# Patient Record
Sex: Male | Born: 1965 | Race: Black or African American | Hispanic: No | Marital: Married | Smoking: Never smoker
Health system: Southern US, Community
[De-identification: ages and names within clinical notes are randomized; demographics above are authoritative.]

## PROBLEM LIST (undated history)

## (undated) DIAGNOSIS — R45851 Suicidal ideations: Secondary | ICD-10-CM

## (undated) DIAGNOSIS — Z8659 Personal history of other mental and behavioral disorders: Secondary | ICD-10-CM

## (undated) DIAGNOSIS — R45850 Homicidal ideations: Secondary | ICD-10-CM

## (undated) DIAGNOSIS — F4310 Post-traumatic stress disorder, unspecified: Secondary | ICD-10-CM

## (undated) DIAGNOSIS — F319 Bipolar disorder, unspecified: Secondary | ICD-10-CM

## (undated) HISTORY — PX: APPENDECTOMY: SHX54

---

## 2018-09-07 ENCOUNTER — Emergency Department (HOSPITAL_COMMUNITY): Payer: Self-pay

## 2018-09-07 ENCOUNTER — Inpatient Hospital Stay (HOSPITAL_COMMUNITY)
Admission: EM | Admit: 2018-09-07 | Discharge: 2018-09-09 | DRG: 208 | Disposition: A | Discharge status: Home / Self Care | Payer: Self-pay | Attending: Pulmonary Disease | Admitting: Pulmonary Disease

## 2018-09-07 ENCOUNTER — Encounter (HOSPITAL_COMMUNITY): Payer: Self-pay | Admitting: Emergency Medicine

## 2018-09-07 ENCOUNTER — Inpatient Hospital Stay (HOSPITAL_COMMUNITY): Payer: Self-pay

## 2018-09-07 ENCOUNTER — Other Ambulatory Visit: Payer: Self-pay

## 2018-09-07 DIAGNOSIS — R401 Stupor: Secondary | ICD-10-CM

## 2018-09-07 DIAGNOSIS — T17910A Gastric contents in respiratory tract, part unspecified causing asphyxiation, initial encounter: Secondary | ICD-10-CM | POA: Diagnosis present

## 2018-09-07 DIAGNOSIS — J9602 Acute respiratory failure with hypercapnia: Principal | ICD-10-CM | POA: Diagnosis present

## 2018-09-07 DIAGNOSIS — Y9259 Other trade areas as the place of occurrence of the external cause: Secondary | ICD-10-CM

## 2018-09-07 DIAGNOSIS — Z0189 Encounter for other specified special examinations: Secondary | ICD-10-CM

## 2018-09-07 DIAGNOSIS — J9600 Acute respiratory failure, unspecified whether with hypoxia or hypercapnia: Secondary | ICD-10-CM | POA: Diagnosis present

## 2018-09-07 DIAGNOSIS — X58XXXA Exposure to other specified factors, initial encounter: Secondary | ICD-10-CM | POA: Diagnosis present

## 2018-09-07 DIAGNOSIS — J9601 Acute respiratory failure with hypoxia: Secondary | ICD-10-CM

## 2018-09-07 DIAGNOSIS — E872 Acidosis: Secondary | ICD-10-CM | POA: Diagnosis present

## 2018-09-07 DIAGNOSIS — Z7401 Bed confinement status: Secondary | ICD-10-CM

## 2018-09-07 DIAGNOSIS — G92 Toxic encephalopathy: Secondary | ICD-10-CM | POA: Diagnosis present

## 2018-09-07 DIAGNOSIS — Z23 Encounter for immunization: Secondary | ICD-10-CM

## 2018-09-07 DIAGNOSIS — R000 Tachycardia, unspecified: Secondary | ICD-10-CM | POA: Diagnosis present

## 2018-09-07 DIAGNOSIS — R451 Restlessness and agitation: Secondary | ICD-10-CM | POA: Diagnosis not present

## 2018-09-07 DIAGNOSIS — E876 Hypokalemia: Secondary | ICD-10-CM | POA: Diagnosis present

## 2018-09-07 DIAGNOSIS — R739 Hyperglycemia, unspecified: Secondary | ICD-10-CM | POA: Diagnosis present

## 2018-09-07 DIAGNOSIS — T407X5A Adverse effect of cannabis (derivatives), initial encounter: Secondary | ICD-10-CM | POA: Diagnosis present

## 2018-09-07 LAB — URINALYSIS, ROUTINE W REFLEX MICROSCOPIC
BILIRUBIN URINE: NEGATIVE
GLUCOSE, UA: 50 mg/dL — AB
Ketones, ur: NEGATIVE mg/dL
LEUKOCYTES UA: NEGATIVE
NITRITE: NEGATIVE
PH: 6 (ref 5.0–8.0)
Protein, ur: 100 mg/dL — AB
SPECIFIC GRAVITY, URINE: 1.013 (ref 1.005–1.030)

## 2018-09-07 LAB — COMPREHENSIVE METABOLIC PANEL
ALT: 52 U/L — ABNORMAL HIGH (ref 0–44)
AST: 47 U/L — ABNORMAL HIGH (ref 15–41)
Albumin: 4.1 g/dL (ref 3.5–5.0)
Alkaline Phosphatase: 62 U/L (ref 38–126)
Anion gap: 26 — ABNORMAL HIGH (ref 5–15)
BILIRUBIN TOTAL: 1 mg/dL (ref 0.3–1.2)
BUN: 8 mg/dL (ref 6–20)
CALCIUM: 8.9 mg/dL (ref 8.9–10.3)
CO2: 13 mmol/L — ABNORMAL LOW (ref 22–32)
CREATININE: 1.59 mg/dL — AB (ref 0.61–1.24)
Chloride: 103 mmol/L (ref 98–111)
GFR, EST AFRICAN AMERICAN: 56 mL/min — AB (ref >60)
GFR, EST NON AFRICAN AMERICAN: 48 mL/min — AB (ref >60)
Glucose, Bld: 213 mg/dL — ABNORMAL HIGH (ref 70–99)
Potassium: 3.2 mmol/L — ABNORMAL LOW (ref 3.5–5.1)
Sodium: 142 mmol/L (ref 135–145)
TOTAL PROTEIN: 7 g/dL (ref 6.5–8.1)

## 2018-09-07 LAB — CBC WITH DIFFERENTIAL/PLATELET
Abs Immature Granulocytes: 0.1 10*3/uL (ref 0.0–0.1)
BASOS PCT: 1 %
Basophils Absolute: 0.1 10*3/uL (ref 0.0–0.1)
EOS ABS: 0.1 10*3/uL (ref 0.0–0.7)
Eosinophils Relative: 1 %
HCT: 44.3 % (ref 39.0–52.0)
Hemoglobin: 13.8 g/dL (ref 13.0–17.0)
IMMATURE GRANULOCYTES: 1 %
LYMPHS ABS: 5.7 10*3/uL — AB (ref 0.7–4.0)
Lymphocytes Relative: 63 %
MCH: 30.9 pg (ref 26.0–34.0)
MCHC: 31.2 g/dL (ref 30.0–36.0)
MCV: 99.3 fL (ref 78.0–100.0)
MONO ABS: 0.4 10*3/uL (ref 0.1–1.0)
MONOS PCT: 4 %
Neutro Abs: 2.6 10*3/uL (ref 1.7–7.7)
Neutrophils Relative %: 30 %
PLATELETS: 221 10*3/uL (ref 150–400)
RBC: 4.46 MIL/uL (ref 4.22–5.81)
RDW: 11.7 % (ref 11.5–15.5)
WBC: 9 10*3/uL (ref 4.0–10.5)

## 2018-09-07 LAB — RAPID URINE DRUG SCREEN, HOSP PERFORMED
AMPHETAMINES: NOT DETECTED
Barbiturates: NOT DETECTED
Benzodiazepines: NOT DETECTED
Cocaine: NOT DETECTED
Opiates: NOT DETECTED
TETRAHYDROCANNABINOL: POSITIVE — AB

## 2018-09-07 LAB — CBG MONITORING, ED: GLUCOSE-CAPILLARY: 97 mg/dL (ref 70–99)

## 2018-09-07 LAB — LACTIC ACID, PLASMA
Lactic Acid, Venous: 4.1 mmol/L (ref 0.5–1.9)
Lactic Acid, Venous: 1.6 mmol/L (ref 0.5–1.9)

## 2018-09-07 LAB — MAGNESIUM: Magnesium: 2.2 mg/dL (ref 1.7–2.4)

## 2018-09-07 LAB — I-STAT TROPONIN, ED: TROPONIN I, POC: 0.01 ng/mL (ref 0.00–0.08)

## 2018-09-07 LAB — POCT I-STAT 3, ART BLOOD GAS (G3+)
Acid-base deficit: 6 mmol/L — ABNORMAL HIGH (ref 0.0–2.0)
Bicarbonate: 19.3 mmol/L — ABNORMAL LOW (ref 20.0–28.0)
O2 Saturation: 100 %
Patient temperature: 98.4
TCO2: 20 mmol/L — ABNORMAL LOW (ref 22–32)
pCO2 arterial: 35.8 mmHg (ref 32.0–48.0)
pH, Arterial: 7.339 — ABNORMAL LOW (ref 7.350–7.450)
pO2, Arterial: 181 mmHg — ABNORMAL HIGH (ref 83.0–108.0)

## 2018-09-07 LAB — GLUCOSE, CAPILLARY: Glucose-Capillary: 93 mg/dL (ref 70–99)

## 2018-09-07 LAB — I-STAT CHEM 8, ED
BUN: 7 mg/dL (ref 6–20)
CALCIUM ION: 1.07 mmol/L — AB (ref 1.15–1.40)
CHLORIDE: 105 mmol/L (ref 98–111)
CREATININE: 1.5 mg/dL — AB (ref 0.61–1.24)
Glucose, Bld: 203 mg/dL — ABNORMAL HIGH (ref 70–99)
HEMATOCRIT: 43 % (ref 39.0–52.0)
Hemoglobin: 14.6 g/dL (ref 13.0–17.0)
Potassium: 3.1 mmol/L — ABNORMAL LOW (ref 3.5–5.1)
SODIUM: 141 mmol/L (ref 135–145)
TCO2: 16 mmol/L — AB (ref 22–32)

## 2018-09-07 LAB — I-STAT VENOUS BLOOD GAS, ED
Acid-base deficit: 9 mmol/L — ABNORMAL HIGH (ref 0.0–2.0)
BICARBONATE: 20 mmol/L (ref 20.0–28.0)
O2 Saturation: 79 %
PCO2 VEN: 54.9 mmHg (ref 44.0–60.0)
TCO2: 22 mmol/L (ref 22–32)
pH, Ven: 7.169 — CL (ref 7.250–7.430)
pO2, Ven: 55 mmHg — ABNORMAL HIGH (ref 32.0–45.0)

## 2018-09-07 LAB — I-STAT CG4 LACTIC ACID, ED
Lactic Acid, Venous: 13.01 mmol/L (ref 0.5–1.9)
Lactic Acid, Venous: 6.71 mmol/L (ref 0.5–1.9)

## 2018-09-07 LAB — CREATININE, SERUM
Creatinine, Ser: 1.26 mg/dL — ABNORMAL HIGH (ref 0.61–1.24)
GFR calc Af Amer: >60 mL/min (ref >60)
GFR calc non Af Amer: >60 mL/min (ref >60)

## 2018-09-07 LAB — CBC
HCT: 47.1 % (ref 39.0–52.0)
Hemoglobin: 15 g/dL (ref 13.0–17.0)
MCH: 31.4 pg (ref 26.0–34.0)
MCHC: 31.8 g/dL (ref 30.0–36.0)
MCV: 98.7 fL (ref 78.0–100.0)
Platelets: 202 10*3/uL (ref 150–400)
RBC: 4.77 MIL/uL (ref 4.22–5.81)
RDW: 11.7 % (ref 11.5–15.5)
WBC: 3.9 10*3/uL — ABNORMAL LOW (ref 4.0–10.5)

## 2018-09-07 LAB — MRSA PCR SCREENING: MRSA by PCR: NEGATIVE

## 2018-09-07 LAB — TRIGLYCERIDES: Triglycerides: 231 mg/dL — ABNORMAL HIGH (ref <150)

## 2018-09-07 LAB — PROTIME-INR
INR: 1.26
Prothrombin Time: 15.7 seconds — ABNORMAL HIGH (ref 11.4–15.2)

## 2018-09-07 LAB — ACETAMINOPHEN LEVEL

## 2018-09-07 LAB — LIPASE, BLOOD: Lipase: 107 U/L — ABNORMAL HIGH (ref 11–51)

## 2018-09-07 LAB — CK: Total CK: 471 U/L — ABNORMAL HIGH (ref 49–397)

## 2018-09-07 LAB — ETHANOL: ALCOHOL ETHYL (B): 30 mg/dL — AB (ref <10)

## 2018-09-07 LAB — AMMONIA: Ammonia: 114 umol/L — ABNORMAL HIGH (ref 9–35)

## 2018-09-07 LAB — PHOSPHORUS: Phosphorus: 5 mg/dL — ABNORMAL HIGH (ref 2.5–4.6)

## 2018-09-07 MED ORDER — ORAL CARE MOUTH RINSE
15.0000 mL | OROMUCOSAL | Status: DC
  Administered 2018-09-07 – 2018-09-08 (×6): 15 mL via OROMUCOSAL

## 2018-09-07 MED ORDER — FENTANYL CITRATE (PF) 100 MCG/2ML IJ SOLN
INTRAMUSCULAR | Status: AC | PRN
  Administered 2018-09-07: 100 ug via INTRAVENOUS

## 2018-09-07 MED ORDER — FENTANYL 2500MCG IN NS 250ML (10MCG/ML) PREMIX INFUSION
25.0000 ug/h | INTRAVENOUS | Status: DC
  Administered 2018-09-08: 50 ug/h via INTRAVENOUS
  Filled 2018-09-07: qty 250

## 2018-09-07 MED ORDER — FENTANYL CITRATE (PF) 100 MCG/2ML IJ SOLN: 100.0000 ug | INTRAMUSCULAR | Status: DC | PRN

## 2018-09-07 MED ORDER — FENTANYL CITRATE (PF) 100 MCG/2ML IJ SOLN
INTRAMUSCULAR | Status: AC
  Filled 2018-09-07: qty 2

## 2018-09-07 MED ORDER — SUCCINYLCHOLINE CHLORIDE 20 MG/ML IJ SOLN
INTRAMUSCULAR | Status: AC | PRN
  Administered 2018-09-07: 120 mg via INTRAVENOUS

## 2018-09-07 MED ORDER — CHLORHEXIDINE GLUCONATE 0.12% ORAL RINSE (MEDLINE KIT)
15.0000 mL | Freq: Two times a day (BID) | OROMUCOSAL | Status: DC
  Administered 2018-09-07 – 2018-09-08 (×2): 15 mL via OROMUCOSAL

## 2018-09-07 MED ORDER — MIDAZOLAM HCL 2 MG/2ML IJ SOLN
INTRAMUSCULAR | Status: AC
  Filled 2018-09-07: qty 6

## 2018-09-07 MED ORDER — PROPOFOL 1000 MG/100ML IV EMUL
5.0000 ug/kg/min | Freq: Once | INTRAVENOUS | Status: AC
  Administered 2018-09-07: 50 ug/kg/min via INTRAVENOUS
  Filled 2018-09-07: qty 100

## 2018-09-07 MED ORDER — PROPOFOL 1000 MG/100ML IV EMUL
5.0000 ug/kg/min | Freq: Once | INTRAVENOUS | Status: DC
  Administered 2018-09-07: 35 ug/kg/min via INTRAVENOUS

## 2018-09-07 MED ORDER — INFLUENZA VAC SPLIT QUAD 0.5 ML IM SUSY
0.5000 mL | PREFILLED_SYRINGE | INTRAMUSCULAR | Status: AC
  Administered 2018-09-08: 0.5 mL via INTRAMUSCULAR

## 2018-09-07 MED ORDER — MIDAZOLAM HCL 5 MG/5ML IJ SOLN
INTRAMUSCULAR | Status: AC | PRN
  Administered 2018-09-07: 5 mg via INTRAVENOUS

## 2018-09-07 MED ORDER — PROPOFOL 1000 MG/100ML IV EMUL
INTRAVENOUS | Status: AC
  Filled 2018-09-07: qty 100

## 2018-09-07 MED ORDER — SODIUM CHLORIDE 0.9 % IV SOLN
INTRAVENOUS | Status: AC | PRN
  Administered 2018-09-07 (×2): 1000 mL via INTRAVENOUS

## 2018-09-07 MED ORDER — SODIUM CHLORIDE 0.9 % IV BOLUS
30.0000 mL/kg | Freq: Once | INTRAVENOUS | Status: AC
  Administered 2018-09-07: 3135 mL via INTRAVENOUS

## 2018-09-07 MED ORDER — POTASSIUM CHLORIDE 10 MEQ/100ML IV SOLN
10.0000 meq | INTRAVENOUS | Status: AC
  Administered 2018-09-07 – 2018-09-08 (×4): 10 meq via INTRAVENOUS
  Filled 2018-09-07: qty 100

## 2018-09-07 MED ORDER — ETOMIDATE 2 MG/ML IV SOLN
INTRAVENOUS | Status: AC | PRN
  Administered 2018-09-07: 20 mg via INTRAVENOUS

## 2018-09-07 MED ORDER — SODIUM CHLORIDE 0.9 % IV SOLN
2.0000 g | Freq: Three times a day (TID) | INTRAVENOUS | Status: DC
  Administered 2018-09-07 – 2018-09-09 (×6): 2 g via INTRAVENOUS
  Filled 2018-09-07 (×7): qty 2

## 2018-09-07 MED ORDER — ONDANSETRON HCL 4 MG/2ML IJ SOLN
INTRAMUSCULAR | Status: AC
  Filled 2018-09-07: qty 2

## 2018-09-07 MED ORDER — MIDAZOLAM HCL 2 MG/2ML IJ SOLN
2.0000 mg | INTRAMUSCULAR | Status: DC | PRN
  Administered 2018-09-08 (×2): 2 mg via INTRAVENOUS
  Filled 2018-09-07 (×2): qty 2

## 2018-09-07 MED ORDER — SODIUM CHLORIDE 0.9 % IV SOLN: 250.0000 mL | INTRAVENOUS | Status: DC | PRN

## 2018-09-07 MED ORDER — MIDAZOLAM HCL 2 MG/2ML IJ SOLN: 5.0000 mg | INTRAMUSCULAR | Status: DC | PRN

## 2018-09-07 MED ORDER — HEPARIN SODIUM (PORCINE) 5000 UNIT/ML IJ SOLN
5000.0000 [IU] | Freq: Three times a day (TID) | INTRAMUSCULAR | Status: DC
  Administered 2018-09-07 – 2018-09-09 (×3): 5000 [IU] via SUBCUTANEOUS
  Filled 2018-09-07 (×4): qty 1

## 2018-09-07 MED ORDER — SODIUM CHLORIDE 0.9 % IV SOLN
INTRAVENOUS | Status: AC | PRN
  Administered 2018-09-07: 1000 mL via INTRAVENOUS

## 2018-09-07 MED ORDER — PROPOFOL 1000 MG/100ML IV EMUL
5.0000 ug/kg/min | INTRAVENOUS | Status: DC
  Administered 2018-09-07: 60 ug/kg/min via INTRAVENOUS
  Administered 2018-09-08: 40 ug/kg/min via INTRAVENOUS
  Filled 2018-09-07: qty 100

## 2018-09-07 MED ORDER — INSULIN ASPART 100 UNIT/ML ~~LOC~~ SOLN: 2.0000 [IU] | SUBCUTANEOUS | Status: DC

## 2018-09-07 MED ORDER — FENTANYL BOLUS VIA INFUSION
50.0000 ug | INTRAVENOUS | Status: DC | PRN
  Administered 2018-09-08 (×4): 50 ug via INTRAVENOUS
  Filled 2018-09-07: qty 50

## 2018-09-07 MED ORDER — FAMOTIDINE IN NACL 20-0.9 MG/50ML-% IV SOLN
20.0000 mg | Freq: Two times a day (BID) | INTRAVENOUS | Status: DC
  Administered 2018-09-07 – 2018-09-08 (×2): 20 mg via INTRAVENOUS
  Filled 2018-09-07: qty 50

## 2018-09-07 NOTE — Progress Notes (Signed)
Patient was transported to 2H04 without incident. Report was called to Merlene LaughterSue Palmer, RRT.

## 2018-09-07 NOTE — Progress Notes (Signed)
PCCM Interval Note   Patient wife at bedside. States that patient went to [place] today to meet someone regarding a car purchase. 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 She denies that he has been short of breath or coughing.   Jovita KussmaulKatalina Eubanks, AGACNP-BC [facility]  Pgr: [phone]  PCCM Pgr: [phone]

## 2018-09-07 NOTE — ED Notes (Signed)
Dr. Trude McburneyPfifer notified of abnormal CG-4 and VBG
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
## 2018-09-07 NOTE — Progress Notes (Signed)
eLink Physician-Brief Progress Note Patient Name: Seth Mcintyre DOB: 1966-01-14 MRN: [id number]   Date of Service  09/07/2018  HPI/Events of Note  Agitation - Currently on a Propofol IV infusion which needs to be reordered.   eICU Interventions  Will order: 1. Propofol IV infusion. Titrate to RASS = 0.         Seth Mcintyre 09/07/2018, 8:01 PM

## 2018-09-07 NOTE — ED Notes (Signed)
Patient transported to CT 

## 2018-09-07 NOTE — Progress Notes (Signed)
Patient was transported to CT and back without incident  
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
## 2018-09-08 ENCOUNTER — Inpatient Hospital Stay (HOSPITAL_COMMUNITY): Payer: Self-pay

## 2018-09-08 ENCOUNTER — Encounter (HOSPITAL_COMMUNITY): Payer: Self-pay

## 2018-09-08 DIAGNOSIS — J9601 Acute respiratory failure with hypoxia: Secondary | ICD-10-CM

## 2018-09-08 LAB — GLUCOSE, CAPILLARY
Glucose-Capillary: 100 mg/dL — ABNORMAL HIGH (ref 70–99)
Glucose-Capillary: 105 mg/dL — ABNORMAL HIGH (ref 70–99)
Glucose-Capillary: 107 mg/dL — ABNORMAL HIGH (ref 70–99)
Glucose-Capillary: 88 mg/dL (ref 70–99)
Glucose-Capillary: 90 mg/dL (ref 70–99)

## 2018-09-08 LAB — BLOOD GAS, ARTERIAL
Acid-base deficit: 2.5 mmol/L — ABNORMAL HIGH (ref 0.0–2.0)
Bicarbonate: 22.6 mmol/L (ref 20.0–28.0)
Drawn by: 24513
FIO2: 0.4
MECHVT: 560 mL
O2 Saturation: 98.3 %
PEEP: 5 cmH2O
Patient temperature: 98.4
RATE: 14 resp/min
pCO2 arterial: 44.2 mmHg (ref 32.0–48.0)
pH, Arterial: 7.328 — ABNORMAL LOW (ref 7.350–7.450)
pO2, Arterial: 124 mmHg — ABNORMAL HIGH (ref 83.0–108.0)

## 2018-09-08 LAB — POCT I-STAT 3, ART BLOOD GAS (G3+)
Acid-base deficit: 3 mmol/L — ABNORMAL HIGH (ref 0.0–2.0)
Bicarbonate: 22.9 mmol/L (ref 20.0–28.0)
O2 Saturation: 99 %
Patient temperature: 99.7
TCO2: 24 mmol/L (ref 22–32)
pCO2 arterial: 42.6 mmHg (ref 32.0–48.0)
pH, Arterial: 7.34 — ABNORMAL LOW (ref 7.350–7.450)
pO2, Arterial: 131 mmHg — ABNORMAL HIGH (ref 83.0–108.0)

## 2018-09-08 LAB — BASIC METABOLIC PANEL
Anion gap: 10 (ref 5–15)
BUN: 13 mg/dL (ref 6–20)
CO2: 23 mmol/L (ref 22–32)
Calcium: 7.5 mg/dL — ABNORMAL LOW (ref 8.9–10.3)
Chloride: 109 mmol/L (ref 98–111)
Creatinine, Ser: 1.18 mg/dL (ref 0.61–1.24)
GFR calc Af Amer: >60 mL/min (ref >60)
GFR calc non Af Amer: >60 mL/min (ref >60)
Glucose, Bld: 108 mg/dL — ABNORMAL HIGH (ref 70–99)
Potassium: 4 mmol/L (ref 3.5–5.1)
Sodium: 142 mmol/L (ref 135–145)

## 2018-09-08 LAB — CBC
HCT: 43.4 % (ref 39.0–52.0)
Hemoglobin: 14.3 g/dL (ref 13.0–17.0)
MCH: 31.1 pg (ref 26.0–34.0)
MCHC: 32.9 g/dL (ref 30.0–36.0)
MCV: 94.3 fL (ref 78.0–100.0)
Platelets: 190 10*3/uL (ref 150–400)
RBC: 4.6 MIL/uL (ref 4.22–5.81)
RDW: 12.1 % (ref 11.5–15.5)
WBC: 5.8 10*3/uL (ref 4.0–10.5)

## 2018-09-08 LAB — URINE CULTURE: Culture: NO GROWTH

## 2018-09-08 LAB — PROTIME-INR
INR: 1.23
Prothrombin Time: 15.4 seconds — ABNORMAL HIGH (ref 11.4–15.2)

## 2018-09-08 MED ORDER — PROPOFOL 1000 MG/100ML IV EMUL
INTRAVENOUS | Status: AC
  Filled 2018-09-08: qty 100

## 2018-09-08 MED ORDER — ORAL CARE MOUTH RINSE
15.0000 mL | Freq: Two times a day (BID) | OROMUCOSAL | Status: DC
  Administered 2018-09-09: 15 mL via OROMUCOSAL

## 2018-09-08 MED ORDER — ONDANSETRON HCL 4 MG/2ML IJ SOLN
4.0000 mg | Freq: Four times a day (QID) | INTRAMUSCULAR | Status: DC | PRN
  Administered 2018-09-08 (×2): 4 mg via INTRAVENOUS
  Filled 2018-09-08 (×2): qty 2

## 2018-09-08 NOTE — Procedures (Addendum)
Extubation Procedure Note  Patient Details:   Name: Seth Mcintyre DOB: Jul 31, 1966 MRN: [id number]   Airway Documentation:    Vent end date: 09/08/18 Vent end time: 0820   Evaluation  O2 sats: stable throughout Complications: No apparent complications Patient did tolerate procedure well. Bilateral Breath Sounds: Clear   Yes   Pt placed on 3 L [place], tolerating well.  No stridor noted.  Pt able to reach 2250 mL using incentive spirometer.  Forest BeckerJean S Ismar Yabut 09/08/2018, 9:12 AM

## 2018-09-08 NOTE — Progress Notes (Signed)
NAME:  Seth Mcintyre, MRN:  [id number], DOB:  1966/08/15, LOS: 1 ADMISSION DATE:  09/07/2018, CONSULTATION DATE:   REFERRING MD:  ER physician CHIEF COMPLAINT:  Respiratory failure  Brief History   Patient found at a hotel with bradypnea, presumably unresponsive. According to the ER nurse, patient's respiratory rate was 4/min. He received 3 doses of narcan with an increase in respiratory rate to 17/min At the time of my evaluation, patient was intubated and on propofol at 835mcg/kg/min No other history available at this time. Significant Hospital Events   09/08/18: Patient regained consciousness and is off sedation.  Consults: date of consult/date signed off & final recs:   Procedures (surgical and bedside):   Significant Diagnostic Tests:  09/07/08: CT Head showed no acute findings. 09/08/18: CT Chest showed IMPRESSION: Partial consolidative changes of the lower lobes consistent with atelectasis or infiltrate. Areas of interstitial prominence and hazy airspace density may represent edema or ARDS  Micro Data:   Antimicrobials:    Subjective:  Awake and alert and following commands. Objective   Blood pressure 122/81, pulse 79, temperature 99.7 F (37.6 C), temperature source Oral, resp. rate 11, height 5\' 9"  (1.753 m), weight 95.2 kg, SpO2 100 %.    Vent Mode: PSV;CPAP FiO2 (%):  [40 %-100 %] 40 % Set Rate:  [14 bmp] 14 bmp Vt Set:  [560 mL] 560 mL PEEP:  [5 cmH20] 5 cmH20 Pressure Support:  [5 cmH20] 5 cmH20 Plateau Pressure:  [9 cmH20-20 cmH20] 20 cmH20   Intake/Output Summary (Last 24 hours) at 09/08/2018 0728 Last data filed at 09/08/2018 [id number] Gross per 24 hour  Intake 4832.16 ml  Output 1745 ml  Net 3087.16 ml   Filed Weights   09/07/18 1538 09/08/18 0422  Weight: 104.5 kg 95.2 kg    Examination: General: Awake. Follows commands and answers simple questions. HENT: PERL, No icterus. Cervical collar in place. ETT/OGT Lungs: Bilateral breath sounds. Clear to  auscultation. No wheezes, crackles or rhonchi Cardiovascular: normal s1s2, regular rhythm. No murmur or gallop. No rub. Abdomen: soft, non tender. No organomegaly or mass. Extremities: Warm to touch. No cyanosis, clubbing or edema. Neuro: Normal muscle strength. AxO x3 GU: Foley catheter  Resolved Hospital Problem list    Assessment & Plan:  Acute respiratory failure with hypercapnia - resolved. Etiology remains unclear. - SBT with probable extubation.  Encephalopathy -etiology unclear -ct head without findings -obtain eeg - will consider neurology evaluation - clinically resolved off sedation.  Hypokalemia -f/u bmp today  Hyperglycemia - begin s/s coverage  Lactic acidosis; resolved -etiology unclear -culture blood and urine. Start empiric antibiotic therapy for possible sepsis. CT chest with non specific findings.   Disposition / Summary of Today's Plan 09/08/18   See above    Diet: assess swallowing function after extubation Pain/Anxiety/Delirium protocol (if indicated): n/a VAP protocol (if indicated)yes DVT prophylaxis: sub q heparin GI prophylaxis: H2 blocker. Will d/c after extubation Hyperglycemia protocol: n/a Mobility: out of bed after extubation. Code Status: FULL Family Communication: no one available or present  Labs   CBC: Recent Labs  Lab 09/07/18 1530 09/07/18 1534 09/07/18 1731  WBC 9.0  --  3.9*  NEUTROABS 2.6  --   --   HGB 13.8 14.6 15.0  HCT 44.3 43.0 47.1  MCV 99.3  --  98.7  PLT 221  --  202   Basic Metabolic Panel: Recent Labs  Lab 09/07/18 1530 09/07/18 1534 09/07/18 1731  NA 142 141  --  K 3.2* 3.1*  --   CL 103 105  --   CO2 13*  --   --   GLUCOSE 213* 203*  --   BUN 8 7  --   CREATININE 1.59* 1.50* 1.26*  CALCIUM 8.9  --   --   MG  --   --  2.2  PHOS  --   --  5.0*   GFR: Estimated Creatinine Clearance: 78.1 mL/min (A) (by C-G formula based on SCr of 1.26 mg/dL (H)). Recent Labs  Lab 09/07/18 1530  09/07/18 1534 09/07/18 1731 09/07/18 1739 09/07/18 1918 09/07/18 2140  WBC 9.0  --  3.9*  --   --   --   LATICACIDVEN  --  13.01*  --  6.71* 4.1* 1.6   Liver Function Tests: Recent Labs  Lab 09/07/18 1530  AST 47*  ALT 52*  ALKPHOS 62  BILITOT 1.0  PROT 7.0  ALBUMIN 4.1   Recent Labs  Lab 09/07/18 1530  LIPASE 107*   Recent Labs  Lab 09/07/18 1731  AMMONIA 114*   ABG    Component Value Date/Time   PHART 7.328 (L) 09/08/2018 0311   PCO2ART 44.2 09/08/2018 0311   PO2ART 124 (H) 09/08/2018 0311   HCO3 22.6 09/08/2018 0311   TCO2 20 (L) 09/07/2018 2007   ACIDBASEDEF 2.5 (H) 09/08/2018 0311   O2SAT 98.3 09/08/2018 0311    Coagulation Profile: Recent Labs  Lab 09/07/18 1530  INR 1.26   Cardiac Enzymes: Recent Labs  Lab 09/07/18 1731  CKTOTAL 471*   HbA1C: No results found for: HGBA1C CBG: Recent Labs  Lab 09/07/18 1745 09/07/18 2002 09/07/18 2337 09/08/18 0312  GLUCAP 97 93 100* 88    Admitting History of Present Illness.   See above Review of Systems:   General: awake and alert Pulmonary: denies dyspnea Cardiac: denies chest pain GI: denies abdominal pain or nausea Past medical history  He,  has no past medical history on file.   Surgical History    Past Surgical History:  Procedure Laterality Date  . APPENDECTOMY       Social History   Social History   Socioeconomic History  . Marital status: Married    Spouse name: Shane Badeaux  . Number of children: Not on file  . Years of education: Not on file  . Highest education level: Not on file  Occupational History  . Not on file  Social Needs  . Financial resource strain: Not hard at all  . Food insecurity:    Worry: Never true    Inability: Never true  . Transportation needs:    Medical: No    Non-medical: No  Tobacco Use  . Smoking status: Never Smoker  . Smokeless tobacco: Never Used  Substance and Sexual Activity  . Alcohol use: Yes    Alcohol/week: 8.0 standard  drinks    Types: 8 Cans of beer per week  . Drug use: Yes    Types: Marijuana  . Sexual activity: Yes  Lifestyle  . Physical activity:    Days per week: 2 days    Minutes per session: 30 min  . Stress: Not at all  Relationships  . Social connections:    Talks on phone: Three times a week    Gets together: Three times a week    Attends religious service: Not on file    Active member of club or organization: Not on file    Attends meetings of clubs or organizations: Not  on file    Relationship status: Not on file  . Intimate partner violence:    Fear of current or ex partner: Not on file    Emotionally abused: Not on file    Physically abused: Not on file    Forced sexual activity: Not on file  Other Topics Concern  . Not on file  Social History Narrative  . Not on file  ,  reports that he has never smoked. He has never used smokeless tobacco. He reports that he drinks about 8.0 standard drinks of alcohol per week. He reports that he has current or past drug history. Drug: Marijuana.   Family history   His Family history is unknown by patient.   Allergies No Known Allergies  Home meds  Prior to Admission medications   Not on File      CRITICAL CARE Performed by: Elayne Snare   Total critical care time: 34 minutes  Critical care time was exclusive of separately billable procedures and treating other patients.  Critical care was necessary to treat or prevent imminent or life-threatening deterioration.  Critical care was time spent personally by me on the following activities: development of treatment plan with patient and/or surrogate as well as nursing, discussions with consultants, evaluation of patient's response to treatment, examination of patient, obtaining history from patient or surrogate, ordering and performing treatments and interventions, ordering and review of laboratory studies, ordering and review of radiographic studies, pulse oximetry and  re-evaluation of patient's condition.

## 2018-09-09 DIAGNOSIS — J9601 Acute respiratory failure with hypoxia: Secondary | ICD-10-CM

## 2018-09-09 NOTE — Progress Notes (Signed)
52 year old man found at a hotel with bradypnea and unresponsive received Narcan and was intubated. 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  He went to well hotel to meet [employer] regarding a car purchase and smoke some weed.  Denies use of any other drugs.  He does not remember who brought him to the hospital His main complaint today is diarrhea. He had bilateral infiltrates noted on CT and, attributed to aspiration and is on cefepime, leukocytosis is resolved and he is afebrile no sputum production. We will stop this antibiotic. He can be mobilized and if he feels well by midday, he can be discharged  Sheccid Lahmann V. Vassie LollAlva MD [phone]

## 2018-09-09 NOTE — Discharge Summary (Signed)
Physician Discharge Summary  Patient ID: Seth Mcintyre MRN: [id number] DOB/AGE: 18-Apr-1966 52 y.o.  Admit date: 09/07/2018 Discharge date: 09/09/2018    Discharge Diagnoses:  Active Problems:   Acute respiratory failure ([place])                                                       D/c plan by Discharge Diagnosis   Acute respiratory failure with hypercapnia - etiology remains unclear. ??r/t AMS, THC? UDS otherwise negative but not a complete screen.  ?aspiration - no leukocytosis, no sputum production, no dyspnea or cough post extubation  PLAN -  Pulmonary hygiene  PCP f/u  Monitor off abx   Encephalopathy - also unclear etiology but fully resolved. Neg head CT. ?r/t THC +/- additives.  PLAN -  PCP f/u   Lactic acidosis - resolved  PLAN -  PCP f/u with labs   Brief Summary: Seth Mcintyre is a 52 y.o. y/o male with no significant PMH who was admitted 9/21 after being found at a hotel with bradypnea, presumably unresponsive. According to the ER nurse, patient's respiratory rate was 4/min. He received 3 doses of narcan with an increase in respiratory rate to 17/min. He required intubation in ER.  PCCM called to admit.  UDS pos for THC only. He had bilateral infiltrates noted on CT and, attributed to aspiration and was started on cefepime, leukocytosis is resolved and he is afebrile no sputum production.  Cefepime d/c'd.  He was extubated 9/22. Remains hemodynamically stable, respiratory status stable on RA. Ambulating without difficulty on RA.  No c/o.  Wants to go home.    Consults: None   Lines/tubes: ETT 9/21>>9/22  Microbiology/Sepsis markers:   Significant Diagnostic Studies:  CT head 9/21>>>neg acute  CT chest 9/22>>>Partial consolidative changes of the lower lobes consistent with atelectasis or infiltrate. Areas of interstitial prominence and hazy airspace density may represent edema or ARDS.    Vitals:   09/09/18 1000 09/09/18 1100 09/09/18 1200 09/09/18  1232  BP: 131/81 106/70 122/77   Pulse: 85 80 74   Resp: 18 17 19    Temp:    98.8 F (37.1 C)  TempSrc:    Oral  SpO2: 94% 92% 93%   Weight:      Height:         Discharge Labs  BMET Recent Labs  Lab 09/07/18 1530 09/07/18 1534 09/07/18 1731 09/08/18 0709  NA 142 141  --  142  K 3.2* 3.1*  --  4.0  CL 103 105  --  109  CO2 13*  --   --  23  GLUCOSE 213* 203*  --  108*  BUN 8 7  --  13  CREATININE 1.59* 1.50* 1.26* 1.18  CALCIUM 8.9  --   --  7.5*  MG  --   --  2.2  --   PHOS  --   --  5.0*  --      CBC  Recent Labs  Lab 09/07/18 1530 09/07/18 1534 09/07/18 1731 09/08/18 0709  HGB 13.8 14.6 15.0 14.3  HCT 44.3 43.0 47.1 43.4  WBC 9.0  --  3.9* 5.8  PLT 221  --  202 190   Anti-Coagulation Recent Labs  Lab 09/07/18 1530 09/08/18 0709  INR 1.26 1.23  Follow-up Information    [facility]. Go on 10/09/2018.   Specialty:  Family Medicine Why:  at 1:50pm for hospital follow-up appointment. $20 visits  Contact information: [place] [address] [phone]           Allergies as of 09/09/2018   No Known Allergies     Medication List    TAKE these medications   multivitamin tablet Take 1 tablet by mouth daily.         Disposition:  home   Discharged Condition: Seth Mcintyre has met maximum benefit of inpatient care and is medically stable and cleared for discharge.  Patient is pending follow up as above.      Time spent on disposition:  Greater than 35 minutes.   SignedNickolas Madrid, NP 09/09/2018  1:35 PM Pager: [phone] or [phone]

## 2018-09-09 NOTE — Care Management Note (Signed)
Case Management Note  Patient Details  Name: Seth Mcintyre MRN: [id number] Date of Birth: 14-Mar-1966  Subjective/Objective:  52 y.o. y/o male with no significant PMH presented on 09/07/18 after being found at a hotel with bradypnea, presumably unresponsive.                Action/Plan: CM consult acknowledged with request to assist with arranging hospital f/u appt. CM met with patient to discuss transitional needs. Patient independent, lives at home with spouse, with no DME in use. Patient verbalized having primary insurance, but stated insurance card was home with spouse. CM advised patient to contact patient registration and provide insurance information for billing purposes, with patient verbalizing understanding. CM scheduled a hospital f/u appt with [facility] on 10/09/18 @ 1:50pm with patient informed/AVS updated; patient indicated the appt may conflict with his work schedule, with patient advised to reschedule, or contact his insurance company to self schedule an appt with an in-network provider. Patient verbalized his spouse would provide transportation home. No further CM needs at this time.   Expected Discharge Date:                  Expected Discharge Plan:  Home/Self Care  In-House Referral:  NA  Discharge planning Services  CM Consult, Follow-up appt scheduled  Post Acute Care Choice:  NA Choice offered to:  NA  DME Arranged:  N/A DME Agency:  NA  HH Arranged:  NA HH Agency:  NA  Status of Service:  Completed, signed off  If discussed at [place] of Stay Meetings, dates discussed:    Additional Comments:  Midge Minium RN, BSN, NCM-BC, ACM-RN [phone] 09/09/2018, 12:18 PM

## 2018-09-09 NOTE — Progress Notes (Signed)
Pt discharged to home with wife via private vehicle. All discharge education provided. States no pain and no further questions. No acutecardiopulmonary distress noted. Vital signs stable.  Alphonzo DublinChristina Jabreel Chimento, RN 09/09/2018 1600

## 2018-09-10 LAB — HIV ANTIBODY (ROUTINE TESTING W REFLEX): HIV Screen 4th Generation wRfx: NONREACTIVE

## 2018-09-12 LAB — CULTURE, BLOOD (ROUTINE X 2)
Culture: NO GROWTH
Culture: NO GROWTH

## 2018-10-09 ENCOUNTER — Inpatient Hospital Stay (INDEPENDENT_AMBULATORY_CARE_PROVIDER_SITE_OTHER): Payer: Self-pay | Admitting: Physician Assistant

## 2019-09-30 ENCOUNTER — Emergency Department (HOSPITAL_COMMUNITY)
Admission: EM | Admit: 2019-09-30 | Discharge: 2019-09-30 | Disposition: A | Discharge status: Home / Self Care | Payer: Self-pay | Attending: Emergency Medicine | Admitting: Emergency Medicine

## 2019-09-30 ENCOUNTER — Other Ambulatory Visit: Payer: Self-pay

## 2019-09-30 ENCOUNTER — Encounter (HOSPITAL_COMMUNITY): Payer: Self-pay

## 2019-09-30 DIAGNOSIS — K029 Dental caries, unspecified: Secondary | ICD-10-CM | POA: Insufficient documentation

## 2019-09-30 DIAGNOSIS — Z79899 Other long term (current) drug therapy: Secondary | ICD-10-CM | POA: Insufficient documentation

## 2019-09-30 MED ORDER — IBUPROFEN 400 MG PO TABS
400.0000 mg | ORAL_TABLET | Freq: Once | ORAL | Status: AC
  Administered 2019-09-30: 10:00:00 400 mg via ORAL
  Filled 2019-09-30: qty 1

## 2019-09-30 MED ORDER — CHLORHEXIDINE GLUCONATE 0.12 % MT SOLN: 15.0000 mL | Freq: Two times a day (BID) | OROMUCOSAL | 0 refills | Status: DC

## 2019-09-30 MED ORDER — HYDROCODONE-ACETAMINOPHEN 5-325 MG PO TABS
2.0000 | ORAL_TABLET | Freq: Once | ORAL | Status: AC
  Administered 2019-09-30: 2 via ORAL
  Filled 2019-09-30: qty 2

## 2019-09-30 MED ORDER — CLINDAMYCIN HCL 150 MG PO CAPS
450.0000 mg | ORAL_CAPSULE | Freq: Once | ORAL | Status: AC
  Administered 2019-09-30: 10:00:00 450 mg via ORAL
  Filled 2019-09-30: qty 3

## 2019-09-30 MED ORDER — CLINDAMYCIN HCL 150 MG PO CAPS: 450.0000 mg | ORAL_CAPSULE | Freq: Three times a day (TID) | ORAL | 0 refills | Status: AC

## 2019-09-30 NOTE — Discharge Instructions (Signed)
You have been diagnosed today with Dental Pain.  At this time there does not appear to be the presence of an emergent medical condition, however there is always the potential for conditions to change. Please read and follow the below instructions.  Please return to the Emergency Department immediately for any new or worsening symptoms. Please be sure to follow up with your Primary Care Provider within one week regarding your visit today; please call their office to schedule an appointment even if you are feeling better for a follow-up visit. Please take the antibiotic clindamycin as prescribed to avoid dental infection. You have been given medication today called Norco.  This is a narcotic medication.  Do not drive or operate machinery for the rest of the day as it can make you drowsy.  Do not drink alcohol or take other sedating medications for the rest of the day as this will worsen side effects. Please take Ibuprofen (Advil, motrin) and Tylenol (acetaminophen) to relieve your pain.  You may take up to 400 MG (2 pills) of normal strength ibuprofen every 8 hours as needed.  In between doses of ibuprofen you make take tylenol, up to 500 mg (one extra strength pill).  Do not take more than 3,000 mg tylenol in a 24 hour period.  Please check all medication labels as many medications such as pain and cold medications may contain tylenol.  Do not drink alcohol while taking these medications.  Do not take other NSAID'S while taking ibuprofen (such as aleve or naproxen).  Please take ibuprofen with food to decrease stomach upset. You may use the mouth rinse Peridex as prescribed up with symptoms.  You do not swallow Peridex.  Rinse and spit this medication.  Get help right away if: You cannot open your mouth. You are having trouble breathing or swallowing. You have a fever. Your face, neck, or jaw is swollen. You have any new/concerning or worsening of symptoms  Please read the additional information  packets attached to your discharge summary.  Do not take your medicine if  develop an itchy rash, swelling in your mouth or lips, or difficulty breathing; call 911 and seek immediate emergency medical attention if this occurs.  Note: Portions of this text may have been transcribed using voice recognition software. Every effort was made to ensure accuracy; however, inadvertent computerized transcription errors may still be present.

## 2019-09-30 NOTE — ED Triage Notes (Signed)
Pt c/o dental pain x 2-3 days. States unable to chew.

## 2019-09-30 NOTE — ED Provider Notes (Signed)
[facility] EMERGENCY DEPARTMENT Provider Note   CSN: [id number] Arrival date & time: 09/30/19  6237     History   Chief Complaint Chief Complaint  Patient presents with  . Dental Pain    HPI Seth Mcintyre is a 53 y.o. male otherwise healthy presents today for left upper dental pain x2 days.  Describes pain as a constant throbbing sensation moderate intensity worsened with chewing and without alleviating factors.  He has taken 2 Aleve without improvement.  Denies radiation of pain.  Reports tooth recently broke.  He reports that he does not have a dentist.  Denies fever/chills, headache/vision changes, trismus, swelling of face/head/neck, sore throat, difficulty swallowing, nausea/vomiting, abdominal pain, drooling or any additional concerns.     HPI  History reviewed. No pertinent past medical history.  Patient Active Problem List   Diagnosis Date Noted  . Acute respiratory failure ([place]) 09/07/2018    Past Surgical History:  Procedure Laterality Date  . APPENDECTOMY          Home Medications    Prior to Admission medications   Medication Sig Start Date End Date Taking? Authorizing Provider  chlorhexidine (PERIDEX) 0.12 % solution Use as directed 15 mLs in the mouth or throat 2 (two) times daily. Rinse and Spit; Do Not Swallow 09/30/19   Nuala Alpha A, PA-C  clindamycin (CLEOCIN) 150 MG capsule Take 3 capsules (450 mg total) by mouth 3 (three) times daily for 7 days. 09/30/19 10/07/19  Nuala Alpha A, PA-C  Multiple Vitamin (MULTIVITAMIN) tablet Take 1 tablet by mouth daily.    [provider]    Family History Family History  Family history unknown: Yes    Social History Social History   Tobacco Use  . Smoking status: Never Smoker  . Smokeless tobacco: Never Used  Substance Use Topics  . Alcohol use: Yes    Alcohol/week: 8.0 standard drinks    Types: 8 Cans of beer per week  . Drug use: Yes    Types: Marijuana      Allergies   Patient has no known allergies.   Review of Systems Review of Systems Ten systems are reviewed and are negative for acute change except as noted in the HPI   Physical Exam Updated Vital Signs BP (!) 155/97 (BP Location: Right Arm)   Pulse 65   Temp 99.2 F (37.3 C) (Oral)   Resp 16   Ht 5\' 9"  (1.753 m)   Wt 102.1 kg   SpO2 98%   BMI 33.23 kg/m   Physical Exam Constitutional:      General: He is not in acute distress.    Appearance: Normal appearance. He is well-developed. He is not ill-appearing or diaphoretic.  HENT:     Head: Normocephalic and atraumatic. No raccoon eyes or Battle's sign.     Jaw: There is normal jaw occlusion. No trismus.     Right Ear: External ear normal.     Left Ear: External ear normal.     Nose: Nose normal.     Mouth/Throat:      Comments: Very poor dentition overall with multiple missing teeth.  Pain to percussion of tooth #14.    The patient has normal phonation and is in control of secretions. No stridor.  Midline uvula without edema. Soft palate rises symmetrically. No tonsillar erythema, swelling or exudates. Tongue protrusion is normal, floor of mouth is soft. No trismus. No creptius on neck palpation. No gingival erythema, swelling or fluctuance  noted. Mucus membranes moist. Eyes:     General: Vision grossly intact. Gaze aligned appropriately.     Pupils: Pupils are equal, round, and reactive to light.  Neck:     Musculoskeletal: Full passive range of motion without pain, normal range of motion and neck supple. No neck rigidity or crepitus.     Trachea: Trachea and phonation normal. No tracheal tenderness or tracheal deviation.  Pulmonary:     Effort: Pulmonary effort is normal. No respiratory distress.  Abdominal:     General: There is no distension.     Palpations: Abdomen is soft.     Tenderness: There is no abdominal tenderness. There is no guarding or rebound.  Musculoskeletal: Normal range of motion.  Skin:     General: Skin is warm and dry.  Neurological:     Mental Status: He is alert.     GCS: GCS eye subscore is 4. GCS verbal subscore is 5. GCS motor subscore is 6.     Comments: Speech is clear and goal oriented, follows commands Major Cranial nerves without deficit, no facial droop Moves extremities without ataxia, coordination intact  Psychiatric:        Behavior: Behavior normal.      ED Treatments / Results  Labs (all labs ordered are listed, but only abnormal results are displayed) Labs Reviewed - No data to display  EKG None  Radiology No results found.  Procedures Procedures (including critical care time)  Medications Ordered in ED Medications  HYDROcodone-acetaminophen (NORCO/VICODIN) 5-325 MG per tablet 2 tablet (2 tablets Oral Given 09/30/19 1008)  clindamycin (CLEOCIN) capsule 450 mg (450 mg Oral Given 09/30/19 1008)  ibuprofen (ADVIL) tablet 400 mg (400 mg Oral Given 09/30/19 1008)     Initial Impression / Assessment and Plan / ED Course  I have reviewed the triage vital signs and the nursing notes.  Pertinent labs & imaging results that were available during my care of the patient were reviewed by me and considered in my medical decision making (see chart for details).    53 year old otherwise healthy male presents today with left upper dental pain.  Very poor dentition overall with multiple missing and infected teeth without signs or symptoms of dental abscess, no swelling/erythema/tenderness of the gums.  Patient is well-appearing, afebrile, nontoxic, speaking well.  Patient able to swallow without pain.  No signs of swelling or concern for Ludwig's angina/Peritonsilar abscess/Retropharyngeal abscess or other deep tissue infections.  No sign of swelling of the neck, patient has good range of motion of the neck, no trismus. Will treat with clindamycin for 450 mg 3 times daily, Peridex mouth rinse, OTC anti-inflammatories.  Dental resource guide given as no dental  coverage today, patient to call offices today after discharge. Patient given Norco here for acute dental pain, reports that he has a ride home from ER, narcotic precautions discussed with patient and he states understanding.  At this time there does not appear to be any evidence of an acute emergency medical condition and the patient appears stable for discharge with appropriate outpatient follow up. Diagnosis was discussed with patient who verbalizes understanding of care plan and is agreeable to discharge. I have discussed return precautions with patient who verbalizes understanding of return precautions. Patient encouraged to follow-up with their PCP and dentist. All questions answered. Patient has been discharged in good condition.   Note: Portions of this report may have been transcribed using voice recognition software. Every effort was made to ensure accuracy; however, inadvertent  computerized transcription errors may still be present. Final Clinical Impressions(s) / ED Diagnoses   Final diagnoses:  Pain due to dental caries    ED Discharge Orders         Ordered    Measure blood pressure  Status:  Canceled     09/30/19 1009    clindamycin (CLEOCIN) 150 MG capsule  3 times daily     09/30/19 1010    chlorhexidine (PERIDEX) 0.12 % solution  2 times daily     09/30/19 1010           Elizabeth Palau 09/30/19 1021    Terald Sleeper, MD 09/30/19 2028

## 2019-10-13 IMAGING — CT CT CHEST W/O CM
2 of 3 series · 15 of 36 positions shown, 18 images · non-contrast
Comparison: Chest radiograph dated 09/07/2018

CLINICAL DATA: 50-year-old male with acute respiratory failure.

EXAM:
CT CHEST WITHOUT CONTRAST
TECHNIQUE: Multidetector CT imaging of the chest was performed following the
standard protocol without IV contrast.

[Series 3: chest w/o 2mm st · axial · non-contrast · 0.98mm/px · z∈[+1310,+1564]mm · 12 of 149 slices shown, 15 images]
[im 11/149  mediastinal]
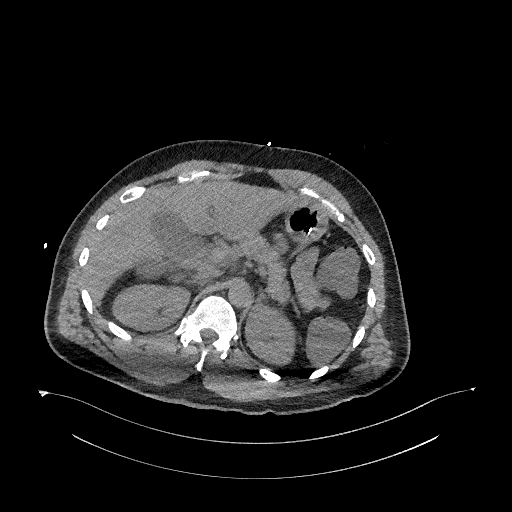
[im 11/149  lung]
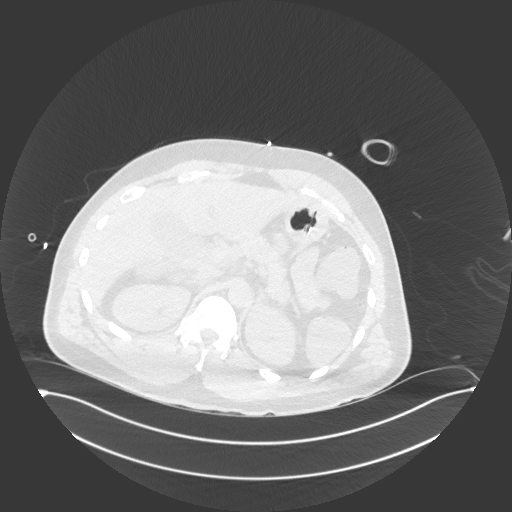
[im 22/149  lung]
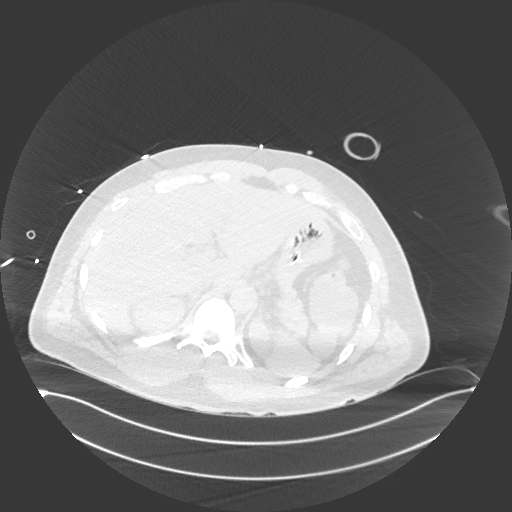
[im 33/149  lung]
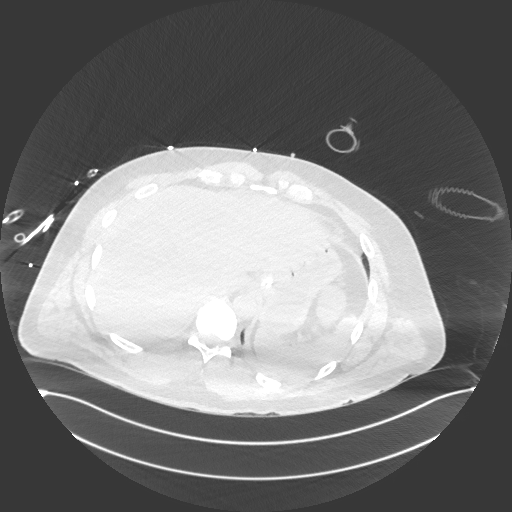
[im 44/149  lung]
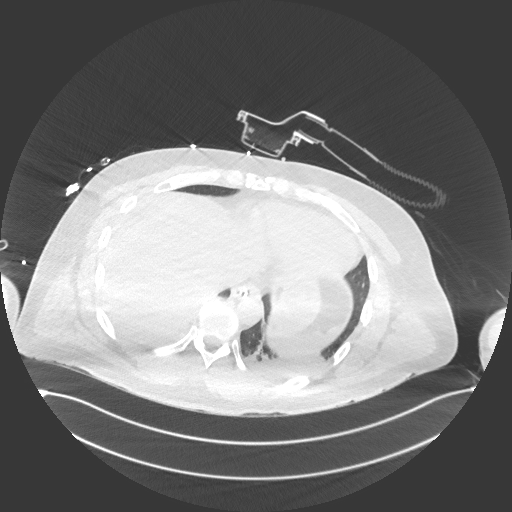
[im 55/149  mediastinal]
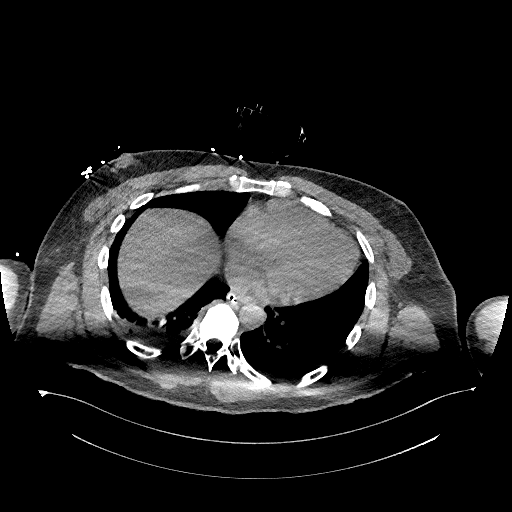
[im 55/149  lung]
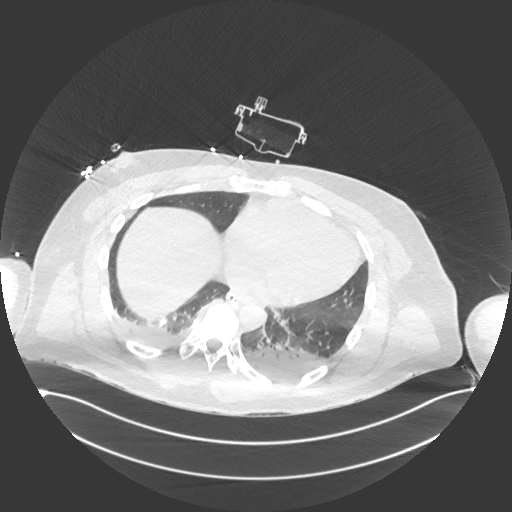
[im 66/149  lung]
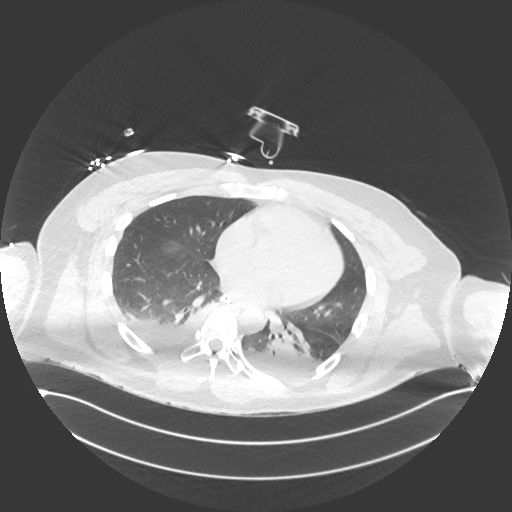
[im 83/149  lung]
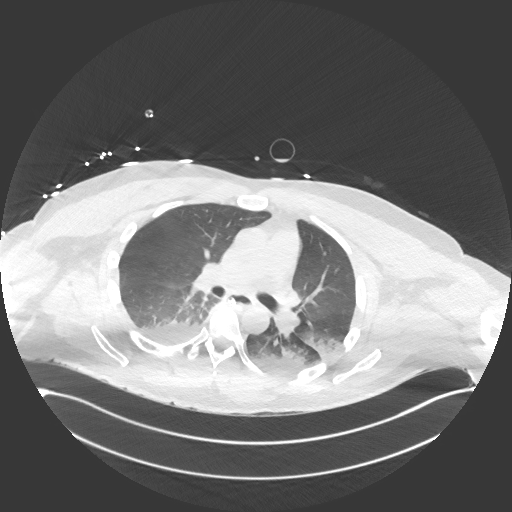
[im 94/149  lung]
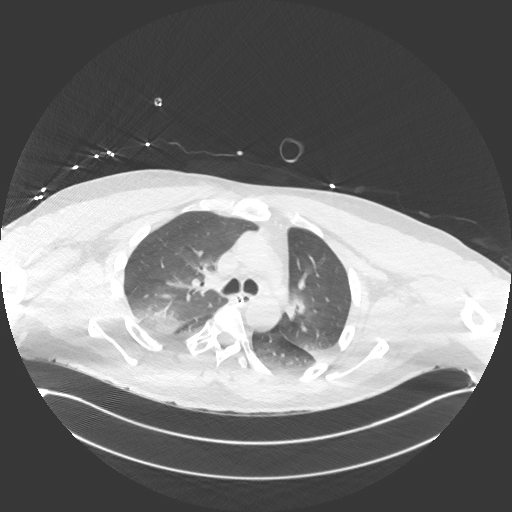
[im 105/149  mediastinal]
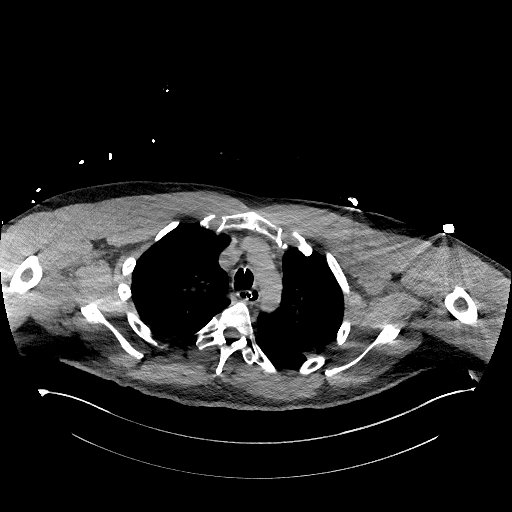
[im 105/149  lung]
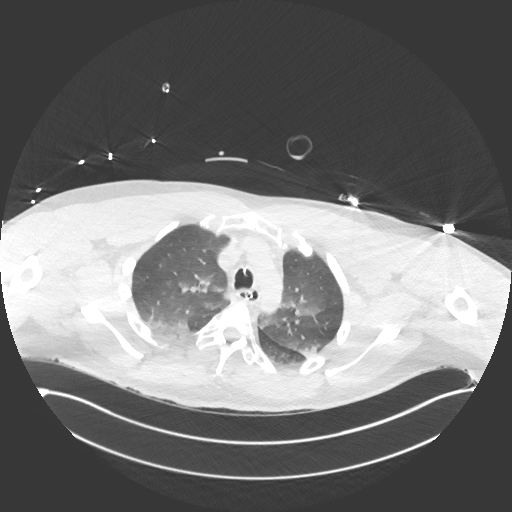
[im 116/149  lung]
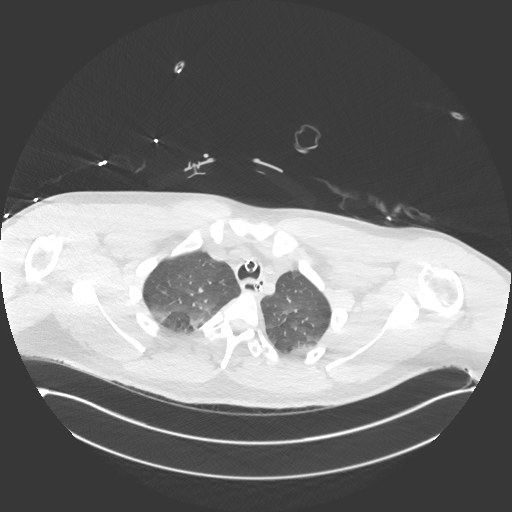
[im 127/149  lung]
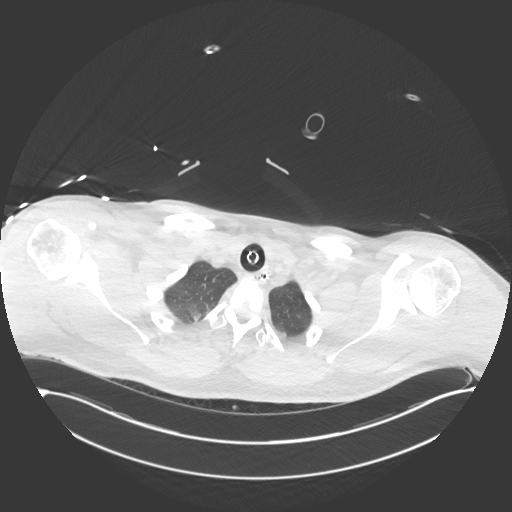
[im 138/149  lung]
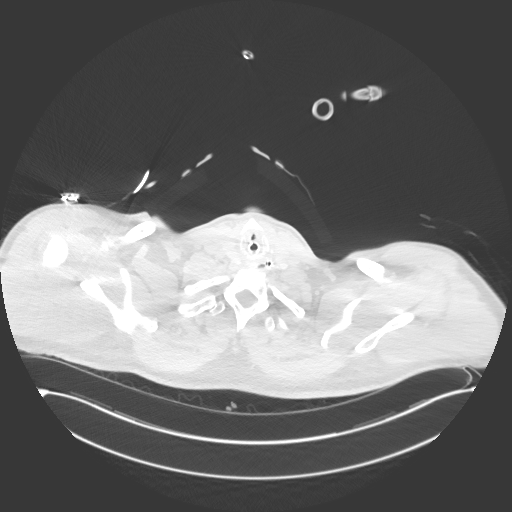

[Series 5: chest w/o 3mm st cor · coronal · non-contrast · 0.59mm/px · 3 of 100 slices shown]
[im 20/100  lung]
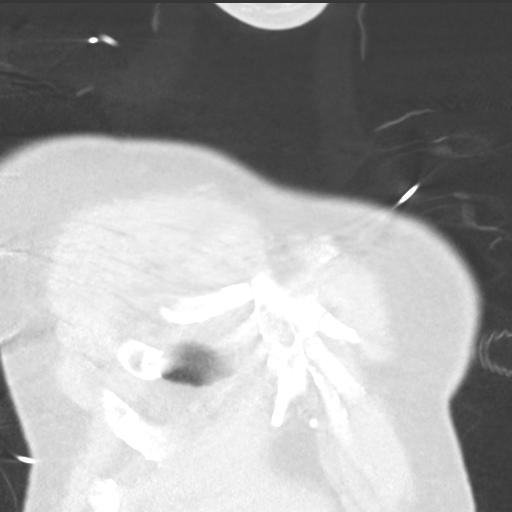
[im 40/100  lung]
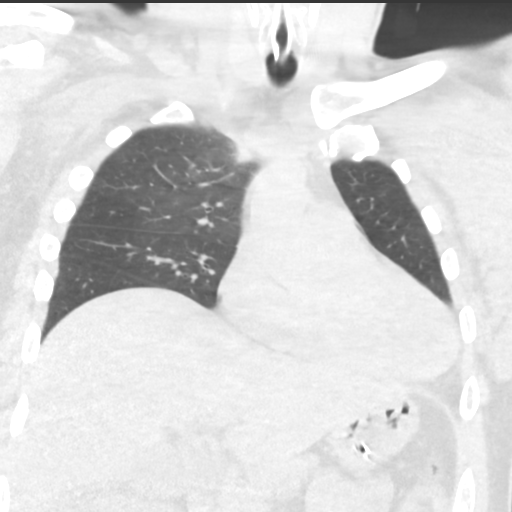
[im 60/100  lung]
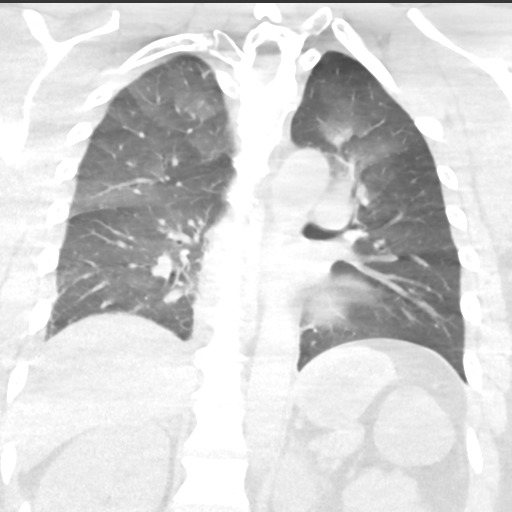

[15 of 36 positions shown; findings below may reference images not displayed]

FINDINGS: Evaluation of this exam is limited in the absence of intravenous
contrast. Evaluation is also limited due to streak artifact caused
by patient's arms.

Cardiovascular: No cardiomegaly or pericardial effusion. The
thoracic aorta and central pulmonary arteries are grossly
unremarkable on this noncontrast CT.

Mediastinum/Nodes: No hilar or mediastinal adenopathy. Enteric tube
within the esophagus extends into the stomach. No mediastinal fluid
collection.

Lungs/Pleura: There are areas of consolidative changes in the lower
lobes bilaterally most consistent with atelectasis or infiltrate.
There is diffuse hazy airspace densities with interstitial
prominence predominantly involving the upper lobes which may
represent a degree of pulmonary edema. No large pleural effusion.
There is no pneumothorax. Endotracheal tube terminates 2 cm above
the carina.

Upper Abdomen: There is loose stool in the visualized colon. The
upper abdomen is otherwise unremarkable.

Musculoskeletal: No chest wall mass or suspicious bone lesions
identified.
IMPRESSION: Partial consolidative changes of the lower lobes consistent with
atelectasis or infiltrate. Areas of interstitial prominence and hazy
airspace density may represent edema or ARDS.

## 2021-03-02 ENCOUNTER — Other Ambulatory Visit: Payer: Self-pay

## 2021-03-02 ENCOUNTER — Ambulatory Visit (INDEPENDENT_AMBULATORY_CARE_PROVIDER_SITE_OTHER): Payer: No Payment, Other | Admitting: Licensed Clinical Social Worker

## 2021-03-02 DIAGNOSIS — F4310 Post-traumatic stress disorder, unspecified: Secondary | ICD-10-CM | POA: Diagnosis not present
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
## 2021-03-22 ENCOUNTER — Ambulatory Visit (HOSPITAL_COMMUNITY): Payer: No Payment, Other | Admitting: Licensed Clinical Social Worker

## 2021-03-22 ENCOUNTER — Telehealth (HOSPITAL_COMMUNITY): Payer: Self-pay | Admitting: *Deleted

## 2021-03-22 NOTE — Telephone Encounter (Signed)
Called this am stating he just got off of a 13 hour shift and needs to cx this am appt at 10 with Adela Lank, his therapist. He wants to reschedule the appt. He was given a new appt 4/29 at 8 and when I called him to give him his new appt he said his water was cut off, and he had just used the money he had to pay his rent and now he doesn't have money for water. Frustrated his new appt is several weeks away, he was hoping for something sooner, like this week. He was given the best appt we had, offered to come to a walk in.

## 2021-04-14 ENCOUNTER — Ambulatory Visit (HOSPITAL_COMMUNITY): Payer: No Payment, Other | Admitting: Licensed Clinical Social Worker

## 2021-04-15 ENCOUNTER — Ambulatory Visit (HOSPITAL_COMMUNITY): Payer: Self-pay | Admitting: Licensed Clinical Social Worker

## 2021-05-06 ENCOUNTER — Ambulatory Visit (HOSPITAL_COMMUNITY): Payer: No Payment, Other | Admitting: Licensed Clinical Social Worker

## 2021-06-28 ENCOUNTER — Other Ambulatory Visit: Payer: Self-pay

## 2021-06-28 ENCOUNTER — Ambulatory Visit (HOSPITAL_COMMUNITY)
Admission: EM | Admit: 2021-06-28 | Discharge: 2021-06-28 | Disposition: A | Discharge status: Home / Self Care | Payer: No Payment, Other | Attending: Psychiatry | Admitting: Psychiatry

## 2021-06-28 DIAGNOSIS — Z630 Problems in relationship with spouse or partner: Secondary | ICD-10-CM | POA: Insufficient documentation

## 2021-06-28 DIAGNOSIS — F32A Depression, unspecified: Secondary | ICD-10-CM | POA: Insufficient documentation

## 2021-06-28 DIAGNOSIS — R441 Visual hallucinations: Secondary | ICD-10-CM | POA: Insufficient documentation

## 2021-06-28 DIAGNOSIS — Z9151 Personal history of suicidal behavior: Secondary | ICD-10-CM | POA: Insufficient documentation

## 2021-06-28 DIAGNOSIS — R45851 Suicidal ideations: Secondary | ICD-10-CM | POA: Insufficient documentation

## 2021-06-28 DIAGNOSIS — R454 Irritability and anger: Secondary | ICD-10-CM | POA: Insufficient documentation

## 2021-06-28 DIAGNOSIS — R440 Auditory hallucinations: Secondary | ICD-10-CM | POA: Insufficient documentation

## 2021-06-28 DIAGNOSIS — Z62810 Personal history of physical and sexual abuse in childhood: Secondary | ICD-10-CM | POA: Insufficient documentation

## 2021-06-28 DIAGNOSIS — T1490XA Injury, unspecified, initial encounter: Secondary | ICD-10-CM

## 2021-06-28 DIAGNOSIS — Z639 Problem related to primary support group, unspecified: Secondary | ICD-10-CM

## 2021-06-28 DIAGNOSIS — R45850 Homicidal ideations: Secondary | ICD-10-CM | POA: Insufficient documentation

## 2021-06-28 NOTE — ED Notes (Signed)
Resident discharged home with resources.  All belongings returned to patient at discharge.  Discharge instructions were reviewed with patient prior to discharge.  No questions voiced by patient at time of discharge.
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
## 2021-06-28 NOTE — Discharge Instructions (Addendum)
Patient is instructed prior to discharge to: Take all medications as prescribed by his/her mental healthcare provider. Report any adverse effects and or reactions from the medicines to his/her outpatient provider promptly. Patient has been instructed & cautioned: To not engage in alcohol and or illegal drug use while on prescription medicines. In the event of worsening symptoms, patient is instructed to call the crisis hotline, 911 and or go to the nearest ED for appropriate evaluation and treatment of symptoms. To follow-up with his/her primary care provider for your other medical issues, concerns and or health care needs.    For trauma-focused therapy for uninsured/under-insured patients, please try to reach the following first: [employer] [facility] *Phone numbers can be found on the handout.  For medication management, please arrive to the [facility], [address], no later than 7 AM.

## 2021-06-29 ENCOUNTER — Ambulatory Visit (INDEPENDENT_AMBULATORY_CARE_PROVIDER_SITE_OTHER): Payer: No Payment, Other | Admitting: Physician Assistant

## 2021-06-29 ENCOUNTER — Other Ambulatory Visit: Payer: Self-pay

## 2021-06-29 ENCOUNTER — Encounter (HOSPITAL_COMMUNITY): Payer: Self-pay | Admitting: Physician Assistant

## 2021-06-29 VITALS — BP 163/103 | HR 63 | Ht 69.0 in | Wt 219.0 lb

## 2021-06-29 DIAGNOSIS — F411 Generalized anxiety disorder: Secondary | ICD-10-CM | POA: Diagnosis not present

## 2021-06-29 DIAGNOSIS — F3164 Bipolar disorder, current episode mixed, severe, with psychotic features: Secondary | ICD-10-CM

## 2021-06-29 DIAGNOSIS — F4310 Post-traumatic stress disorder, unspecified: Secondary | ICD-10-CM

## 2021-06-29 DIAGNOSIS — F319 Bipolar disorder, unspecified: Secondary | ICD-10-CM | POA: Insufficient documentation

## 2021-06-29 DIAGNOSIS — F39 Unspecified mood [affective] disorder: Secondary | ICD-10-CM | POA: Insufficient documentation

## 2021-06-29 MED ORDER — DIVALPROEX SODIUM 500 MG PO DR TAB
500.0000 mg | DELAYED_RELEASE_TABLET | Freq: Two times a day (BID) | ORAL | 1 refills | Status: DC
  Filled 2021-06-29: qty 60, 30d supply, fill #0

## 2021-06-29 MED ORDER — SERTRALINE HCL 25 MG PO TABS
25.0000 mg | ORAL_TABLET | Freq: Every day | ORAL | 1 refills | Status: DC
  Filled 2021-06-29: qty 30, 18d supply, fill #0

## 2021-06-29 MED ORDER — TRAZODONE HCL 50 MG PO TABS
50.0000 mg | ORAL_TABLET | Freq: Every day | ORAL | 1 refills | Status: DC
  Filled 2021-06-29: qty 15, 30d supply, fill #0
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
## 2021-07-04 ENCOUNTER — Other Ambulatory Visit: Payer: Self-pay

## 2021-07-05 ENCOUNTER — Ambulatory Visit (INDEPENDENT_AMBULATORY_CARE_PROVIDER_SITE_OTHER): Payer: No Payment, Other | Admitting: Licensed Clinical Social Worker

## 2021-07-05 ENCOUNTER — Other Ambulatory Visit: Payer: Self-pay

## 2021-07-05 DIAGNOSIS — F4310 Post-traumatic stress disorder, unspecified: Secondary | ICD-10-CM | POA: Diagnosis not present
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
## 2021-08-30 ENCOUNTER — Other Ambulatory Visit: Payer: Self-pay

## 2021-08-30 ENCOUNTER — Ambulatory Visit (INDEPENDENT_AMBULATORY_CARE_PROVIDER_SITE_OTHER): Payer: No Payment, Other | Admitting: Physician Assistant

## 2021-08-30 DIAGNOSIS — F4310 Post-traumatic stress disorder, unspecified: Secondary | ICD-10-CM | POA: Diagnosis not present

## 2021-08-30 DIAGNOSIS — F411 Generalized anxiety disorder: Secondary | ICD-10-CM

## 2021-08-30 DIAGNOSIS — F3164 Bipolar disorder, current episode mixed, severe, with psychotic features: Secondary | ICD-10-CM | POA: Diagnosis not present

## 2021-08-30 MED ORDER — HYDROXYZINE HCL 10 MG PO TABS
10.0000 mg | ORAL_TABLET | Freq: Three times a day (TID) | ORAL | 1 refills | Status: DC | PRN
  Filled 2021-08-30 – 2021-09-07 (×2): qty 75, 25d supply, fill #0

## 2021-08-30 MED ORDER — SERTRALINE HCL 50 MG PO TABS
50.0000 mg | ORAL_TABLET | Freq: Every day | ORAL | 1 refills | Status: DC
  Filled 2021-08-30 – 2021-09-07 (×2): qty 30, 30d supply, fill #0
  Filled 2021-11-01: qty 30, 30d supply, fill #1

## 2021-08-30 MED ORDER — TRAZODONE HCL 50 MG PO TABS
50.0000 mg | ORAL_TABLET | Freq: Every day | ORAL | 1 refills | Status: DC
  Filled 2021-08-30 – 2021-09-07 (×2): qty 30, 30d supply, fill #0

## 2021-08-30 MED ORDER — LAMOTRIGINE 25 MG PO TABS
25.0000 mg | ORAL_TABLET | Freq: Every day | ORAL | 1 refills | Status: DC
  Filled 2021-08-30 – 2021-09-07 (×2): qty 30, 30d supply, fill #0
  Filled 2021-11-01: qty 30, 30d supply, fill #1
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
## 2021-09-02 ENCOUNTER — Encounter (HOSPITAL_COMMUNITY): Payer: Self-pay | Admitting: Physician Assistant

## 2021-09-06 ENCOUNTER — Other Ambulatory Visit: Payer: Self-pay

## 2021-09-07 ENCOUNTER — Other Ambulatory Visit: Payer: Self-pay

## 2021-09-07 ENCOUNTER — Ambulatory Visit (INDEPENDENT_AMBULATORY_CARE_PROVIDER_SITE_OTHER): Payer: No Payment, Other | Admitting: Licensed Clinical Social Worker

## 2021-09-07 DIAGNOSIS — F4310 Post-traumatic stress disorder, unspecified: Secondary | ICD-10-CM
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
## 2021-09-20 ENCOUNTER — Ambulatory Visit (HOSPITAL_COMMUNITY): Payer: No Payment, Other | Admitting: Licensed Clinical Social Worker

## 2021-09-21 ENCOUNTER — Ambulatory Visit (HOSPITAL_COMMUNITY): Payer: No Payment, Other | Admitting: Licensed Clinical Social Worker

## 2021-10-05 ENCOUNTER — Ambulatory Visit (HOSPITAL_COMMUNITY): Payer: No Payment, Other | Admitting: Licensed Clinical Social Worker

## 2021-11-01 ENCOUNTER — Other Ambulatory Visit: Payer: Self-pay

## 2021-11-01 ENCOUNTER — Ambulatory Visit (HOSPITAL_COMMUNITY): Payer: No Payment, Other | Admitting: Family

## 2021-11-01 ENCOUNTER — Encounter (HOSPITAL_COMMUNITY): Payer: Self-pay | Admitting: Family

## 2021-11-01 DIAGNOSIS — F3164 Bipolar disorder, current episode mixed, severe, with psychotic features: Secondary | ICD-10-CM

## 2021-11-01 DIAGNOSIS — F411 Generalized anxiety disorder: Secondary | ICD-10-CM

## 2021-11-01 DIAGNOSIS — F4310 Post-traumatic stress disorder, unspecified: Secondary | ICD-10-CM

## 2021-11-01 MED ORDER — MIRTAZAPINE 7.5 MG PO TABS
7.5000 mg | ORAL_TABLET | Freq: Every day | ORAL | 0 refills | Status: DC
  Filled 2021-11-01: qty 30, 30d supply, fill #0

## 2021-11-01 MED ORDER — LAMOTRIGINE 25 MG PO TABS
25.0000 mg | ORAL_TABLET | Freq: Every day | ORAL | 1 refills | Status: DC
  Filled 2021-11-01: qty 30, 30d supply, fill #0

## 2021-11-01 MED ORDER — SERTRALINE HCL 50 MG PO TABS
50.0000 mg | ORAL_TABLET | Freq: Every day | ORAL | 1 refills | Status: DC
  Filled 2021-11-01: qty 30, 30d supply, fill #0

## 2021-11-01 NOTE — Progress Notes (Signed)
BH MD/PA/NP OP Progress Note  11/01/2021 12:45 PM Seth Mcintyre  MRN:  [id number]  Chief Complaint:  Chief Complaint   Medication Management   Seth Mcintyre stated " Do I look okay? well  I am note."  HPI: Seth Mcintyre was seen and evaluated face-to-face for medication management.  Patient reports he has been noncompliant with medications due to financial resources.  States he is currently attempting to apply for disability.  States he has a mental health screening for disability on November 23.  States he was taking medications as directed however has not had medications in the past month.  Reports ongoing depression and anxiety.  States inability to sleep.  Patient reports " even with trazodone I cannot sleep." Reports a strained relationship between he and his wife. "  I cannot continue to live like this.."  Denied suicidal or homicidal ideations.  Denies auditory or visual hallucinations.  Reports worsening pression due to psychosocial stressors.  Seth Mcintyre presents irritable, disgruntled and agitated.  States that he recently had GeneSight testing completed but didn't get any results. CNA to follow up with results.  It was reported unsuccessful attempts were made in order to proceed with testing. "8 attempts were made for patient to approved this test without insurance and no one answered."  GeneSight was never completed.   NP reached out to [facility] regarding any medication assistance program and or discounted medications. Patient to follow-up with this department. Patient is able to make payment arrangements has he has used is "free one tine service."  Patient was made aware. Patient to keep outpatient therapy follow-up appointment. Support, encouragement and reassurances was provided.   Visit Diagnosis:    ICD-10-CM   1. PTSD (post-traumatic stress disorder)  F43.10 sertraline (ZOLOFT) 50 MG tablet    2. Generalized anxiety disorder  F41.1 sertraline (ZOLOFT) 50 MG tablet     3. Bipolar disorder, current episode mixed, severe, with psychotic features (HCC)  F31.64 sertraline (ZOLOFT) 50 MG tablet    lamoTRIgine (LAMICTAL) 25 MG tablet      Past Psychiatric History:   Past Medical History: History reviewed. No pertinent past medical history.  Past Surgical History:  Procedure Laterality Date   APPENDECTOMY      Family Psychiatric History:   Family History:  Family History  Family history unknown: Yes    Social History:  Social History   Socioeconomic History   Marital status: Married    Spouse name: Metallurgist   Number of children: Not on file   Years of education: Not on file   Highest education level: Not on file  Occupational History   Not on file  Tobacco Use   Smoking status: Never   Smokeless tobacco: Never  Vaping Use   Vaping Use: Never used  Substance and Sexual Activity   Alcohol use: Yes    Alcohol/week: 8.0 standard drinks    Types: 8 Cans of beer per week   Drug use: Yes    Types: Marijuana   Sexual activity: Yes  Other Topics Concern   Not on file  Social History Narrative   Not on file   Social Determinants of Health   Financial Resource Strain: Not on file  Food Insecurity: Not on file  Transportation Needs: Not on file  Physical Activity: Not on file  Stress: Not on file  Social Connections: Not on file    Allergies: No Known Allergies  Metabolic Disorder Labs: No results found for: HGBA1C, MPG  No results found for: PROLACTIN Lab Results  Component Value Date   TRIG 231 (H) 09/07/2018   No results found for: TSH  Therapeutic Level Labs: No results found for: LITHIUM No results found for: VALPROATE No components found for:  CBMZ  Current Medications: Current Outpatient Medications  Medication Sig Dispense Refill   chlorhexidine (PERIDEX) 0.12 % solution Use as directed 15 mLs in the mouth or throat 2 (two) times daily. Rinse and Spit; Do Not Swallow 120 mL 0   hydrOXYzine  (ATARAX/VISTARIL) 10 MG tablet Take 1 tablet (10 mg total) by mouth 3 (three) times daily as needed. 75 tablet 1   lamoTRIgine (LAMICTAL) 25 MG tablet Take 1 tablet (25 mg total) by mouth daily. 30 tablet 1   mirtazapine (REMERON) 7.5 MG tablet Take 1 tablet (7.5 mg total) by mouth at bedtime. 30 tablet 0   Multiple Vitamin (MULTIVITAMIN) tablet Take 1 tablet by mouth daily.     sertraline (ZOLOFT) 50 MG tablet Take 1 tablet (50 mg total) by mouth daily. 30 tablet 1   No current facility-administered medications for this visit.     Musculoskeletal: Strength & Muscle Tone: within normal limits Gait & Station: normal Patient leans: N/A  Psychiatric Specialty Exam: Review of Systems  Psychiatric/Behavioral:  Positive for agitation, decreased concentration and sleep disturbance. The patient is nervous/anxious.   All other systems reviewed and are negative.  Blood pressure 135/87, pulse 66, height 5\' 9"  (1.753 m), weight 218 lb (98.9 kg), SpO2 100 %.Body mass index is 32.19 kg/m.  General Appearance: Casual  Eye Contact:  Good  Speech:  Clear and Coherent  Volume:  Normal  Mood:  Anxious and Depressed  Affect:  Congruent  Thought Process:  Coherent  Orientation:  Full (Time, Place, and Person)  Thought Content: Logical   Suicidal Thoughts:  No  Homicidal Thoughts:  No  Memory:  Immediate;   Good Recent;   Good  Judgement:  Good  Insight:  Good  Psychomotor Activity:  Normal  Concentration:  Concentration: Good  Recall:  Good  Fund of Knowledge: Good  Language: Good  Akathisia:  No  Handed:  Right  AIMS (if indicated): done  Assets:  Communication Skills Resilience Social Support  ADL's:  Intact  Cognition: WNL  Sleep:  Fair   Screenings: GAD-7    Visit from 06/29/2021 in [facility]  Total GAD-7 Score 21      PHQ2-9    Flowsheet Row Office Visit from 08/30/2021 in [facility] Office  Visit from 06/29/2021 in [facility] Counselor from 03/02/2021 in [facility]  PHQ-2 Total Score 6 6 6   PHQ-9 Total Score 23 27 27       Flowsheet Row Office Visit from 08/30/2021 in [facility] Office Visit from 06/29/2021 in [facility] Counselor from 03/02/2021 in [facility]  C-SSRS RISK CATEGORY Low Risk Low Risk Low Risk        Assessment and Plan: Seth Mcintyre is a 55 year old African-American male.  With a charted history of major depressive disorder, bipolar disorder and posttraumatic stress disorder.  Reports worsening mood.  patient is prescribed Lamictal, Zoloft and trazodone.  Denied that he is taking medications as directed.  Due to financial concerns.  Reports he has applied for multiple compassionate assistance programs without success.  States he has been on multiple medication and past  however medications have been ineffective.  NP contacted community health and wellness for medication pricing.  Was reported total for medications would be $18 and patient is able to make a payment arrangement.  Patient was receptive to plan.  Patient to follow-up 6 weeks.   Major depressive disorder Bipolar disorder Posttraumatic stress disorder Generalized anxiety disorder  Continue Lamictal 25 mg daily Continue Zoloft 50 mg daily Discontinue trazodone 50 mg and will initiate Remeron 7.5 mg nightly  No medication adjustments at this time due to inconsistency with medication.    Patient to follow-up 6 weeks for medication management     Oneta Rack, NP 11/01/2021, 12:45 PM

## 2021-11-07 ENCOUNTER — Other Ambulatory Visit: Payer: Self-pay

## 2021-11-09 ENCOUNTER — Ambulatory Visit (HOSPITAL_COMMUNITY): Payer: No Payment, Other | Admitting: Licensed Clinical Social Worker

## 2021-11-28 ENCOUNTER — Ambulatory Visit (HOSPITAL_COMMUNITY): Payer: Self-pay | Admitting: Licensed Clinical Social Worker

## 2021-12-28 ENCOUNTER — Telehealth (INDEPENDENT_AMBULATORY_CARE_PROVIDER_SITE_OTHER): Payer: No Payment, Other | Admitting: Physician Assistant

## 2021-12-28 DIAGNOSIS — F3164 Bipolar disorder, current episode mixed, severe, with psychotic features: Secondary | ICD-10-CM | POA: Diagnosis not present

## 2021-12-28 DIAGNOSIS — F411 Generalized anxiety disorder: Secondary | ICD-10-CM | POA: Diagnosis not present

## 2021-12-28 DIAGNOSIS — F4310 Post-traumatic stress disorder, unspecified: Secondary | ICD-10-CM
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
## 2021-12-30 ENCOUNTER — Encounter (HOSPITAL_COMMUNITY): Payer: Self-pay | Admitting: Physician Assistant

## 2021-12-30 DIAGNOSIS — F5105 Insomnia due to other mental disorder: Secondary | ICD-10-CM | POA: Insufficient documentation

## 2021-12-30 DIAGNOSIS — F99 Mental disorder, not otherwise specified: Secondary | ICD-10-CM | POA: Insufficient documentation

## 2021-12-30 MED ORDER — MIRTAZAPINE 15 MG PO TABS
15.0000 mg | ORAL_TABLET | Freq: Every day | ORAL | 1 refills | Status: DC
  Filled 2021-12-30: qty 30, 30d supply, fill #0

## 2021-12-30 MED ORDER — LAMOTRIGINE 25 MG PO TABS
50.0000 mg | ORAL_TABLET | Freq: Every day | ORAL | 1 refills | Status: DC
  Filled 2021-12-30: qty 60, 30d supply, fill #0

## 2021-12-30 MED ORDER — SERTRALINE HCL 100 MG PO TABS
100.0000 mg | ORAL_TABLET | Freq: Every day | ORAL | 1 refills | Status: DC
  Filled 2021-12-30: qty 30, 30d supply, fill #0

## 2022-01-02 ENCOUNTER — Other Ambulatory Visit: Payer: Self-pay

## 2022-01-04 ENCOUNTER — Other Ambulatory Visit: Payer: Self-pay

## 2022-02-10 ENCOUNTER — Emergency Department (HOSPITAL_COMMUNITY)
Admission: EM | Admit: 2022-02-10 | Discharge: 2022-02-10 | Disposition: A | Discharge status: Home / Self Care | Payer: Self-pay | Attending: Student | Admitting: Student

## 2022-02-10 ENCOUNTER — Other Ambulatory Visit: Payer: Self-pay

## 2022-02-10 ENCOUNTER — Encounter (HOSPITAL_COMMUNITY): Payer: Self-pay | Admitting: Emergency Medicine

## 2022-02-10 ENCOUNTER — Emergency Department (HOSPITAL_COMMUNITY): Payer: Self-pay

## 2022-02-10 DIAGNOSIS — S86912A Strain of unspecified muscle(s) and tendon(s) at lower leg level, left leg, initial encounter: Secondary | ICD-10-CM | POA: Insufficient documentation

## 2022-02-10 DIAGNOSIS — S86112A Strain of other muscle(s) and tendon(s) of posterior muscle group at lower leg level, left leg, initial encounter: Secondary | ICD-10-CM

## 2022-02-10 DIAGNOSIS — X501XXA Overexertion from prolonged static or awkward postures, initial encounter: Secondary | ICD-10-CM | POA: Insufficient documentation

## 2022-02-10 MED ORDER — KETOROLAC TROMETHAMINE 15 MG/ML IJ SOLN
15.0000 mg | Freq: Once | INTRAMUSCULAR | Status: AC
  Administered 2022-02-10: 15 mg via INTRAMUSCULAR
  Filled 2022-02-10: qty 1

## 2022-02-10 MED ORDER — LIDOCAINE 5 % EX PTCH
1.0000 | MEDICATED_PATCH | CUTANEOUS | Status: DC
  Administered 2022-02-10: 1 via TRANSDERMAL
  Filled 2022-02-10: qty 1

## 2022-02-10 MED ORDER — NAPROXEN 375 MG PO TABS
375.0000 mg | ORAL_TABLET | Freq: Two times a day (BID) | ORAL | 0 refills | Status: DC
  Filled 2022-02-10: qty 20, 10d supply, fill #0

## 2022-02-10 NOTE — Progress Notes (Signed)
Orthopedic Tech Progress Note Patient Details:  BRYON MACGILLIVRAY 08-06-1966 [id number]  Ortho Devices Type of Ortho Device: Crutches, CAM walker Ortho Device/Splint Location: lle Ortho Device/Splint Interventions: Ordered, Application, Adjustment   Post Interventions Patient Tolerated: Well  Edwina Barth 02/10/2022, 5:19 PM

## 2022-02-10 NOTE — ED Triage Notes (Signed)
Patient complains of sudden onset of left calf pain that started yesterday while pushing a car onto a trailer and he felt a "pop" in his left calf. Pain was worse yesterday, patient states he applied a heat pack and pain improved but is still painful. Patient alert, oriented, and in no apparent distress at this time.

## 2022-02-10 NOTE — ED Provider Triage Note (Signed)
Emergency Medicine Provider Triage Evaluation Note  Seth Mcintyre , a 56 y.o. male  was evaluated in triage.  Pt complains of left calf pain.  This that yesterday he was pushing an object when he felt a "pop," to the back of his leg.  Patient reports it felt like someone threw a baseball at the back of his leg.  Patient has had pain to left calf since then.  Is able to stand and ambulate without difficulty.  Review of Systems  Positive: Left calf pain Negative: Numbness, weakness, color change, wound, leg swelling or tenderness  Physical Exam  BP (!) 151/89 (BP Location: Right Arm)    Pulse 74    Temp 98.8 F (37.1 C) (Oral)    Resp 16    SpO2 99%  Gen:   Awake, no distress   Resp:  Normal effort  MSK:   Moves extremities without difficulty  Other:  Tenderness to left calf.  No swelling to bilateral lower extremities.  +2 left DP pulse.  Sensation intact to all digits of left foot.  No rash or erythema noted to left lower leg.  Medical Decision Making  Medically screening exam initiated at 1:29 PM.  Appropriate orders placed.  Seth Mcintyre was informed that the remainder of the evaluation will be completed by another provider, this initial triage assessment does not replace that evaluation, and the importance of remaining in the ED until their evaluation is complete.     Loni Beckwith, [place] 02/10/22 1330

## 2022-02-10 NOTE — ED Provider Notes (Signed)
[facility] EMERGENCY DEPARTMENT Provider Note  CSN: [id number] Arrival date & time: 02/10/22 1246  Chief Complaint(s) Leg Pain  HPI Seth Mcintyre is a 56 y.o. male with PMH anxiety, PTSD who presents emergency department for evaluation of left leg pain.  Patient states that he was pushing a car yesterday when he heard a pop in his calf and has had trouble walking on his leg since.  He is able to bear weight on the leg but his range of motion is limited by pain.  Pain worse with plantarflexion.  Pain limited to the calf, no pain at the site of the Achilles tendon.  Denies chest pain, shortness of breath, abdominal pain, nausea, vomiting or other systemic symptoms.   Leg Pain  Past Medical History History reviewed. No pertinent past medical history. 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 Authorizing Provider  naproxen (NAPROSYN) 375 MG tablet Take 1 tablet (375 mg total) by mouth 2 (two) times daily. 02/10/22  Yes Vasili Fok, MD  chlorhexidine (PERIDEX) 0.12 % solution Use as directed 15 mLs in the mouth or throat 2 (two) times daily. Rinse and Spit; Do Not Swallow 09/30/19   Nuala Alpha A, PA-C  hydrOXYzine (ATARAX/VISTARIL) 10 MG tablet Take 1 tablet (10 mg total) by mouth 3 (three) times daily as needed. 08/30/21   Nwoko, Terese Door, PA  lamoTRIgine (LAMICTAL) 25 MG tablet Take 2 tablets (50 mg total) by mouth daily. 12/30/21 12/30/22  Nwoko, Terese Door, PA  mirtazapine (REMERON) 15 MG tablet Take 1 tablet (15 mg total) by mouth at bedtime. 12/30/21   Nwoko, Terese Door, PA   Multiple Vitamin (MULTIVITAMIN) tablet Take 1 tablet by mouth daily.    [provider]  sertraline (ZOLOFT) 100 MG tablet Take 1 tablet (100 mg total) by mouth daily. 12/30/21 12/30/22  Malachy Mood, PA                                                                                                                                    Past Surgical History Past Surgical History:  Procedure Laterality Date   APPENDECTOMY     Family History Family History  Family history unknown: Yes    Social History Social History   Tobacco Use   Smoking status: Never   Smokeless tobacco: Never  Vaping Use   Vaping Use: Never used  Substance Use Topics   Alcohol use: Yes    Alcohol/week: 8.0 standard drinks    Types: 8 Cans of beer per week   Drug use: Yes  Types: Marijuana   Allergies Patient has no known allergies.  Review of Systems Review of Systems  Musculoskeletal:  Positive for arthralgias and myalgias.   Physical Exam Vital Signs  I have reviewed the triage vital signs BP 132/85    Pulse 65    Temp 98.1 F (36.7 C) (Oral)    Resp 16    Ht 5\' 9"  (1.753 m)    Wt 99.8 kg    SpO2 100%    BMI 32.49 kg/m   Physical Exam Vitals and nursing note reviewed.  Constitutional:      General: He is not in acute distress.    Appearance: He is well-developed.  HENT:     Head: Normocephalic and atraumatic.  Eyes:     Conjunctiva/sclera: Conjunctivae normal.  Cardiovascular:     Rate and Rhythm: Normal rate and regular rhythm.     Heart sounds: No murmur heard. Pulmonary:     Effort: Pulmonary effort is normal. No respiratory distress.     Breath sounds: Normal breath sounds.  Abdominal:     Palpations: Abdomen is soft.     Tenderness: There is no abdominal tenderness.  Musculoskeletal:        General: Swelling and tenderness (Calf) present.     Cervical back: Neck supple.  Skin:    General: Skin is warm and dry.     Capillary Refill: Capillary refill takes less than  2 seconds.  Neurological:     Mental Status: He is alert.  Psychiatric:        Mood and Affect: Mood normal.    ED Results and Treatments Labs (all labs ordered are listed, but only abnormal results are displayed) Labs Reviewed - No data to display                                                                                                                        Radiology DG Tibia/Fibula Left  Result Date: 02/10/2022 CLINICAL DATA:  Sudden onset left calf pain, felt popping calf EXAM: LEFT TIBIA AND FIBULA - 2 VIEW COMPARISON:  None. FINDINGS: There is no acute fracture or dislocation. Bony alignment is normal. The joint spaces appear preserved. The soft tissues are unremarkable. IMPRESSION: Unremarkable tibia/fibula radiographs. Electronically Signed   By: Valetta Mole M.D.   On: 02/10/2022 16:05    Pertinent labs & imaging results that were available during my care of the patient were reviewed by me and considered in my medical decision making (see MDM for details).  Medications Ordered in ED Medications  lidocaine (LIDODERM) 5 % 1 patch (1 patch Transdermal Patch Applied 02/10/22 1534)  ketorolac (TORADOL) 15 MG/ML injection 15 mg (15 mg Intramuscular Given 02/10/22 1534)  Procedures Procedures  (including critical care time)  Medical Decision Making / ED Course   This patient presents to the ED for concern of calf pain, this involves an extensive number of treatment options, and is a complaint that carries with it a high risk of complications and morbidity.  The differential diagnosis includes muscle tear, fracture, ligamentous injury  MDM: Patient seen emergency department for evaluation of left calf pain.  Physical exam reveals tenderness over the medial head of the left gastrocnemius and no tenderness at the site of the Achilles tendon.  X-ray  unremarkable.  Patient presentation consistent with a gastrocnemius tear and pain treated with Toradol and lidocaine patch.  Patient will be placed in a cam boot and given crutches with orthopedic follow-up.  Patient then discharged with Naprosyn and orthopedic follow-up.   Additional history obtained:  -External records from outside source obtained and reviewed including: Chart review including previous notes, labs, imaging, consultation notes   Imaging Studies ordered: I ordered imaging studies including XR tib fib  I independently visualized and interpreted imaging. I agree with the radiologist interpretation   Medicines ordered and prescription drug management: Meds ordered this encounter  Medications   ketorolac (TORADOL) 15 MG/ML injection 15 mg   lidocaine (LIDODERM) 5 % 1 patch   naproxen (NAPROSYN) 375 MG tablet    Sig: Take 1 tablet (375 mg total) by mouth 2 (two) times daily.    Dispense:  20 tablet    Refill:  0    -I have reviewed the patients home medicines and have made adjustments as needed  Critical interventions none   Cardiac Monitoring: The patient was maintained on a cardiac monitor.  I personally viewed and interpreted the cardiac monitored which showed an underlying rhythm of: NSR  Social Determinants of Health:  Factors impacting patients care include: none   Reevaluation: After the interventions noted above, I reevaluated the patient and found that they have :improved  Co morbidities that complicate the patient evaluation History reviewed. No pertinent past medical history.    Dispostion: I considered admission for this patient, but his diagnosis of a gastrocnemius tear does not meet inpatient criteria and is safe for outpatient orthopedic follow-up     Final Clinical Impression(s) / ED Diagnoses Final diagnoses:  Rupture of left gastrocnemius tendon, initial encounter     @PCDICTATION @    Teressa Lower, MD 02/10/22 1627

## 2022-02-17 ENCOUNTER — Other Ambulatory Visit: Payer: Self-pay

## 2022-02-22 ENCOUNTER — Encounter (HOSPITAL_COMMUNITY): Payer: Self-pay | Admitting: Physician Assistant

## 2022-02-22 ENCOUNTER — Telehealth (INDEPENDENT_AMBULATORY_CARE_PROVIDER_SITE_OTHER): Payer: No Payment, Other | Admitting: Physician Assistant

## 2022-02-22 DIAGNOSIS — F4310 Post-traumatic stress disorder, unspecified: Secondary | ICD-10-CM

## 2022-02-22 DIAGNOSIS — F3164 Bipolar disorder, current episode mixed, severe, with psychotic features: Secondary | ICD-10-CM | POA: Diagnosis not present

## 2022-02-22 DIAGNOSIS — F411 Generalized anxiety disorder: Secondary | ICD-10-CM | POA: Diagnosis not present

## 2022-02-22 MED ORDER — MIRTAZAPINE 15 MG PO TABS
15.0000 mg | ORAL_TABLET | Freq: Every day | ORAL | 1 refills | Status: DC
  Filled 2022-02-22: qty 30, 30d supply, fill #0

## 2022-02-22 MED ORDER — QUETIAPINE FUMARATE 50 MG PO TABS
50.0000 mg | ORAL_TABLET | Freq: Two times a day (BID) | ORAL | 1 refills | Status: DC
  Filled 2022-02-22: qty 60, 30d supply, fill #0

## 2022-02-22 MED ORDER — LAMOTRIGINE 25 MG PO TABS
50.0000 mg | ORAL_TABLET | Freq: Every day | ORAL | 1 refills | Status: DC
  Filled 2022-02-22: qty 60, 30d supply, fill #0

## 2022-02-22 MED ORDER — SERTRALINE HCL 50 MG PO TABS
50.0000 mg | ORAL_TABLET | Freq: Every day | ORAL | 1 refills | Status: DC
  Filled 2022-02-22: qty 30, 30d supply, fill #0

## 2022-02-22 MED ORDER — HYDROXYZINE HCL 10 MG PO TABS
10.0000 mg | ORAL_TABLET | Freq: Three times a day (TID) | ORAL | 1 refills | Status: DC | PRN
  Filled 2022-02-22: qty 75, 25d supply, fill #0

## 2022-02-22 NOTE — Progress Notes (Cosign Needed)
BH MD/PA/NP OP Progress Note  Virtual Visit via Telephone Note  I connected with Seth Mcintyre on 02/22/22 at 11:30 AM EST by telephone and verified that I am speaking with the correct person using two identifiers.  Location: Patient: Home Provider: Clinic   I discussed the limitations, risks, security and privacy concerns of performing an evaluation and management service by telephone and the availability of in person appointments. I also discussed with the patient that there may be a patient responsible charge related to this service. The patient expressed understanding and agreed to proceed.  Follow Up Instructions:  I discussed the assessment and treatment plan with the patient. The patient was provided an opportunity to ask questions and all were answered. The patient agreed with the plan and demonstrated an understanding of the instructions.   The patient was advised to call back or seek an in-person evaluation if the symptoms worsen or if the condition fails to improve as anticipated.  I provided 15 minutes of non-face-to-face time during this encounter.  Meta Hatchet, PA   02/22/2022 8:12 PM [name]  MRN:  [id number]  Chief Complaint:  Chief Complaint  Patient presents with   Follow-up   Medication Management   HPI:   Seth Mcintyre. [place]  Visit Diagnosis:    ICD-10-CM   1. Bipolar disorder, current episode mixed, severe, with psychotic features (HCC)  F31.64 lamoTRIgine (LAMICTAL) 25 MG tablet    QUEtiapine (SEROQUEL) 50 MG tablet    2. Generalized anxiety disorder  F41.1 hydrOXYzine (ATARAX) 10 MG tablet    sertraline (ZOLOFT) 50 MG tablet    mirtazapine (REMERON) 15 MG tablet    3. PTSD (post-traumatic stress disorder)  F43.10 sertraline (ZOLOFT) 50 MG tablet    mirtazapine (REMERON) 15 MG tablet      Past Psychiatric History:  PTSD Generalized anxiety disorder Bipolar Disorder  Past Medical History: History reviewed. No pertinent past  medical history.  Past Surgical History:  Procedure Laterality Date   APPENDECTOMY      Family Psychiatric History:  Family psychiatric history unknown due to patient being adopted  Family History:  Family History  Family history unknown: Yes    Social History:  Social History   Socioeconomic History   Marital status: Married    Spouse name: Metallurgist   Number of children: Not on file   Years of education: Not on file   Highest education level: Not on file  Occupational History   Not on file  Tobacco Use   Smoking status: Never   Smokeless tobacco: Never  Vaping Use   Vaping Use: Never used  Substance and Sexual Activity   Alcohol use: Yes    Alcohol/week: 8.0 standard drinks    Types: 8 Cans of beer per week   Drug use: Yes    Types: Marijuana   Sexual activity: Yes  Other Topics Concern   Not on file  Social History Narrative   Not on file   Social Determinants of Health   Financial Resource Strain: Not on file  Food Insecurity: Not on file  Transportation Needs: Not on file  Physical Activity: Not on file  Stress: Not on file  Social Connections: Not on file    Allergies: No Known Allergies  Metabolic Disorder Labs: No results found for: HGBA1C, MPG No results found for: PROLACTIN Lab Results  Component Value Date   TRIG 231 (H) 09/07/2018   No results found for: TSH  Therapeutic Level Labs:  No results found for: LITHIUM No results found for: VALPROATE No components found for:  CBMZ  Current Medications: Current Outpatient Medications  Medication Sig Dispense Refill   QUEtiapine (SEROQUEL) 50 MG tablet Take 1 tablet (50 mg total) by mouth 2 (two) times daily. 30 tablet 1   chlorhexidine (PERIDEX) 0.12 % solution Use as directed 15 mLs in the mouth or throat 2 (two) times daily. Rinse and Spit; Do Not Swallow 120 mL 0   hydrOXYzine (ATARAX) 10 MG tablet Take 1 tablet (10 mg total) by mouth 3 (three) times daily as needed. 75 tablet 1    lamoTRIgine (LAMICTAL) 25 MG tablet Take 2 tablets (50 mg total) by mouth daily. 60 tablet 1   mirtazapine (REMERON) 15 MG tablet Take 1 tablet (15 mg total) by mouth at bedtime. 30 tablet 1   Multiple Vitamin (MULTIVITAMIN) tablet Take 1 tablet by mouth daily.     naproxen (NAPROSYN) 375 MG tablet Take 1 tablet (375 mg total) by mouth 2 (two) times daily. 20 tablet 0   sertraline (ZOLOFT) 50 MG tablet Take 1 tablet (50 mg total) by mouth daily. 30 tablet 1   No current facility-administered medications for this visit.     Musculoskeletal: Strength & Muscle Tone: Unable to assess due to telemedicine visit Gait & Station: Unable to assess due to telemedicine visit Patient leans: Unable to assess due to telemedicine visit  Psychiatric Specialty Exam: Review of Systems  Psychiatric/Behavioral:  Positive for agitation and sleep disturbance. Negative for decreased concentration, dysphoric mood, hallucinations, self-injury and suicidal ideas. The patient is nervous/anxious. The patient is not hyperactive.    There were no vitals taken for this visit.There is no height or weight on file to calculate BMI.  General Appearance: Unable to assess due to telemedicine visit  Eye Contact:  Unable to assess due to telemedicine visit  Speech:  Clear and Coherent and Normal Rate  Volume:  Normal  Mood:  Anxious, Depressed, and Irritable  Affect:  Congruent and Depressed  Thought Process:  Coherent, Goal Directed, and Descriptions of Associations: Intact  Orientation:  Full (Time, Place, and Person)  Thought Content: WDL   Suicidal Thoughts:  No  Homicidal Thoughts:  No  Memory:  Immediate;   Good Recent;   Good Remote;   Fair  Judgement:  Fair  Insight:  Fair  Psychomotor Activity:  Restlessness  Concentration:  Concentration: Good and Attention Span: Good  Recall:  Good  Fund of Knowledge: Good  Language: Good  Akathisia:  No  Handed:  Right  AIMS (if indicated): not done  Assets:   Communication Skills Desire for Improvement Housing Social Support  ADL's:  Intact  Cognition: WNL  Sleep:  Poor   Screenings: GAD-7    Flowsheet Row [facility]  Total GAD-7 Score 21 21 21       PHQ2-9    Flowsheet Row [facility]  PHQ-2 Total Score 6 6 6 6 6   PHQ-9 Total Score 25 26 23 27 27       Flowsheet Row Video Visit  from 02/22/2022 in [facility] ED from 02/10/2022 in [facility] EMERGENCY DEPARTMENT Video Visit from 12/28/2021 in [facility]  C-SSRS RISK CATEGORY Low Risk No Risk Low Risk        Assessment and Plan:     Collaboration of Care: Collaboration of Care: Medication Management AEB provider managing patient's psychiatric medications and Psychiatrist AEB patient being followed by this mental health provider  Patient/Guardian was advised Release of Information must be obtained prior to any record release in order to collaborate their care with an outside provider. Patient/Guardian was advised if they have not already done so to contact the registration department to sign all necessary forms in order for [place] to release information regarding their care.   Consent: Patient/Guardian gives verbal consent for treatment and assignment of benefits for services provided during this visit. Patient/Guardian expressed understanding and agreed to proceed.   1. Bipolar  disorder, current episode mixed, severe, with psychotic features (HCC)  - lamoTRIgine (LAMICTAL) 25 MG tablet; Take 2 tablets (50 mg total) by mouth daily.  Dispense: 60 tablet; Refill: 1 - QUEtiapine (SEROQUEL) 50 MG tablet; Take 1 tablet (50 mg total) by mouth 2 (two) times daily.  Dispense: 30 tablet; Refill: 1  2. Generalized anxiety disorder  - hydrOXYzine (ATARAX) 10 MG tablet; Take 1 tablet (10 mg total) by mouth 3 (three) times daily as needed.  Dispense: 75 tablet; Refill: 1 - sertraline (ZOLOFT) 50 MG tablet; Take 1 tablet (50 mg total) by mouth daily.  Dispense: 30 tablet; Refill: 1 - mirtazapine (REMERON) 15 MG tablet; Take 1 tablet (15 mg total) by mouth at bedtime.  Dispense: 30 tablet; Refill: 1  3. PTSD (post-traumatic stress disorder)  - sertraline (ZOLOFT) 50 MG tablet; Take 1 tablet (50 mg total) by mouth daily.  Dispense: 30 tablet; Refill: 1 - mirtazapine (REMERON) 15 MG tablet; Take 1 tablet (15 mg total) by mouth at bedtime.  Dispense: 30 tablet; Refill: 1  Patient to follow up in 2 months Provider spent a total of 15 minutes with the patient/reviewing patient's chart  Meta Hatchet, PA 02/22/2022, 8:12 PM

## 2022-02-23 ENCOUNTER — Other Ambulatory Visit: Payer: Self-pay

## 2022-02-24 ENCOUNTER — Other Ambulatory Visit: Payer: Self-pay

## 2022-02-27 ENCOUNTER — Other Ambulatory Visit: Payer: Self-pay

## 2022-04-26 ENCOUNTER — Other Ambulatory Visit: Payer: Self-pay

## 2022-04-26 ENCOUNTER — Telehealth (INDEPENDENT_AMBULATORY_CARE_PROVIDER_SITE_OTHER): Payer: No Payment, Other | Admitting: Physician Assistant

## 2022-04-26 DIAGNOSIS — F3164 Bipolar disorder, current episode mixed, severe, with psychotic features: Secondary | ICD-10-CM | POA: Diagnosis not present

## 2022-04-26 DIAGNOSIS — F411 Generalized anxiety disorder: Secondary | ICD-10-CM | POA: Diagnosis not present

## 2022-04-26 DIAGNOSIS — F4310 Post-traumatic stress disorder, unspecified: Secondary | ICD-10-CM

## 2022-04-26 MED ORDER — QUETIAPINE FUMARATE 150 MG PO TABS
150.0000 mg | ORAL_TABLET | Freq: Every day | ORAL | 1 refills | Status: DC
  Filled 2022-04-26: qty 30, 30d supply, fill #0

## 2022-04-26 MED ORDER — MIRTAZAPINE 15 MG PO TABS
15.0000 mg | ORAL_TABLET | Freq: Every day | ORAL | 1 refills | Status: DC
  Filled 2022-04-26: qty 30, 30d supply, fill #0

## 2022-04-26 MED ORDER — LAMOTRIGINE 100 MG PO TABS
100.0000 mg | ORAL_TABLET | Freq: Every day | ORAL | 1 refills | Status: DC
  Filled 2022-04-26: qty 30, 30d supply, fill #0

## 2022-04-26 MED ORDER — SERTRALINE HCL 100 MG PO TABS
100.0000 mg | ORAL_TABLET | Freq: Every day | ORAL | 1 refills | Status: DC
  Filled 2022-04-26: qty 30, 30d supply, fill #0

## 2022-04-26 MED ORDER — HYDROXYZINE HCL 10 MG PO TABS
10.0000 mg | ORAL_TABLET | Freq: Three times a day (TID) | ORAL | 1 refills | Status: DC | PRN
  Filled 2022-04-26: qty 75, 25d supply, fill #0

## 2022-04-27 ENCOUNTER — Other Ambulatory Visit: Payer: Self-pay

## 2022-04-27 ENCOUNTER — Encounter (HOSPITAL_COMMUNITY): Payer: Self-pay | Admitting: Physician Assistant

## 2022-04-27 NOTE — Progress Notes (Signed)
BH MD/PA/NP OP Progress Note ? ?Virtual Visit via Telephone Note ? ?I connected with Seth Mcintyre on 04/27/22 at  1:30 PM EDT by telephone and verified that I am speaking with the correct person using two identifiers. ? ?Location: ?Patient: Home ?Provider: Clinic ?  ?I discussed the limitations, risks, security and privacy concerns of performing an evaluation and management service by telephone and the availability of in person appointments. I also discussed with the patient that there may be a patient responsible charge related to this service. The patient expressed understanding and agreed to proceed. ? ?Follow Up Instructions: ? ?I discussed the assessment and treatment plan with the patient. The patient was provided an opportunity to ask questions and all were answered. The patient agreed with the plan and demonstrated an understanding of the instructions. ?  ?The patient was advised to call back or seek an in-person evaluation if the symptoms worsen or if the condition fails to improve as anticipated. ? ?I provided 20 minutes of non-face-to-face time during this encounter. ? ?Seth Hatchet, PA ? ? ?04/27/2022 8:19 PM ?Seth Mcintyre  ?MRN:  [id number] ? ?Chief Complaint:  ?Chief Complaint  ?Patient presents with  ? Follow-up  ? Medication Management  ? ?HPI:  ? ?Seth Mcintyre is a 56 year old male with a past psychiatric history significant for generalized anxiety disorder, PTSD, and bipolar disorder who presents to [facility] via virtual telephone visit for follow-up and medication management.  Patient is currently being managed on the following medications: ? ?Sertraline 50 mg daily ?Mirtazapine 15 mg at bedtime ?Lamotrigine 50 mg daily ?Seroquel 50 mg 2 times daily ?Hydroxyzine 10 mg 3 times daily as needed ? ?Patient reports that he has been going through a number of stressors since the last encounter.  Patient explains that he has no food stamps and  states that he does not have any food in the house.  Patient reports that his neighbors have also been targeting him.  Patient states that he feels that everything is against him.  He also feels that people are praying for his downfall.  Patient is still waiting to receive disability and states that he feels that he is getting the runaround.  Patient feels that everything in his life is at a standstill and he does not want to be on this earth anymore. ? ?Patient endorses anxiety as well as worsening depression.  Patient states that he is always trying to camouflage his emotions but describes this process as draining.  Patient states that someone in his neighborhood has been antagonizing him and trying to provoke him into anger.  Patient was informed to avoid this individual.  Patient vocalized understanding.  A PHQ-9 screen was performed with the patient scoring a 27.  A GAD-7 screen was also performed with the patient scoring a 21. ? ?Patient is alert and oriented x4, calm, cooperative, and fully engaged in conversation during the encounter.  Patient reports that he feels messed up and is in a dark space.  Patient endorses passive suicidal ideations but denies any specific plan or intent.  Patient also endorses passive homicidal ideations towards the individual in his neighborhood that is targeting him.  Patient denies any specific plan to harm the individual.  Patient denies auditory hallucinations but states that he does have visual hallucinations characterized by seeing shadows.  Patient endorses poor sleep stating that he sleeps through light.  Patient endorses good appetite but states that he has  no food.  Patient states that he is able to eat at least 3 meals each day.  Patient denies alcohol consumption, tobacco use, and illicit drug use. ? ?Visit Diagnosis:  ?  ICD-10-CM   ?1. Generalized anxiety disorder  F41.1 sertraline (ZOLOFT) 100 MG tablet  ?  hydrOXYzine (ATARAX) 10 MG tablet  ?  mirtazapine  (REMERON) 15 MG tablet  ?  ?2. PTSD (post-traumatic stress disorder)  F43.10 sertraline (ZOLOFT) 100 MG tablet  ?  mirtazapine (REMERON) 15 MG tablet  ?  ?3. Bipolar disorder, current episode mixed, severe, with psychotic features (HCC)  F31.64 lamoTRIgine (LAMICTAL) 100 MG tablet  ?  QUEtiapine Fumarate 150 MG TABS  ?  ? ? ?Past Psychiatric History:  ?PTSD ?Generalized anxiety disorder ?Bipolar Disorder ? ?Past Medical History: No past medical history on file.  ?Past Surgical History:  ?Procedure Laterality Date  ? APPENDECTOMY    ? ? ?Family Psychiatric History:  ?Family psychiatric history unknown due to patient being adopted ? ?Family History:  ?Family History  ?Family history unknown: Yes  ? ? ?Social History:  ?Social History  ? ?Socioeconomic History  ? Marital status: Married  ?  Spouse name: Faylene KurtzSherri Vanloan  ? Number of children: Not on file  ? Years of education: Not on file  ? Highest education level: Not on file  ?Occupational History  ? Not on file  ?Tobacco Use  ? Smoking status: Never  ? Smokeless tobacco: Never  ?Vaping Use  ? Vaping Use: Never used  ?Substance and Sexual Activity  ? Alcohol use: Yes  ?  Alcohol/week: 8.0 standard drinks  ?  Types: 8 Cans of beer per week  ? Drug use: Yes  ?  Types: Marijuana  ? Sexual activity: Yes  ?Other Topics Concern  ? Not on file  ?Social History Narrative  ? Not on file  ? ?Social Determinants of Health  ? ?Financial Resource Strain: Not on file  ?Food Insecurity: Not on file  ?Transportation Needs: Not on file  ?Physical Activity: Not on file  ?Stress: Not on file  ?Social Connections: Not on file  ? ? ?Allergies: No Known Allergies ? ?Metabolic Disorder Labs: ?No results found for: HGBA1C, MPG ?No results found for: PROLACTIN ?Lab Results  ?Component Value Date  ? TRIG 231 (H) 09/07/2018  ? ?No results found for: TSH ? ?Therapeutic Level Labs: ?No results found for: LITHIUM ?No results found for: VALPROATE ?No components found for:  CBMZ ? ?Current  Medications: ?Current Outpatient Medications  ?Medication Sig Dispense Refill  ? chlorhexidine (PERIDEX) 0.12 % solution Use as directed 15 mLs in the mouth or throat 2 (two) times daily. Rinse and Spit; Do Not Swallow 120 mL 0  ? hydrOXYzine (ATARAX) 10 MG tablet Take 1 tablet (10 mg total) by mouth 3 (three) times daily as needed. 75 tablet 1  ? lamoTRIgine (LAMICTAL) 100 MG tablet Take 1 tablet (100 mg total) by mouth daily. 30 tablet 1  ? mirtazapine (REMERON) 15 MG tablet Take 1 tablet (15 mg total) by mouth at bedtime. 30 tablet 1  ? Multiple Vitamin (MULTIVITAMIN) tablet Take 1 tablet by mouth daily.    ? naproxen (NAPROSYN) 375 MG tablet Take 1 tablet (375 mg total) by mouth 2 (two) times daily. 20 tablet 0  ? QUEtiapine Fumarate 150 MG TABS Take 150 mg by mouth at bedtime. 30 tablet 1  ? sertraline (ZOLOFT) 100 MG tablet Take 1 tablet (100 mg total) by mouth daily. 30 tablet  1  ? ?No current facility-administered medications for this visit.  ? ? ? ?Musculoskeletal: ?Strength & Muscle Tone: Unable to assess due to telemedicine visit ?Gait & Station: Unable to assess due to telemedicine visit ?Patient leans: Unable to assess due to telemedicine visit ? ?Psychiatric Specialty Exam: ?Review of Systems  ?Psychiatric/Behavioral:  Positive for agitation and sleep disturbance. Negative for decreased concentration, dysphoric mood, hallucinations, self-injury and suicidal ideas. The patient is nervous/anxious. The patient is not hyperactive.    ?There were no vitals taken for this visit.There is no height or weight on file to calculate BMI.  ?General Appearance: Unable to assess due to telemedicine visit  ?Eye Contact:  Unable to assess due to telemedicine visit  ?Speech:  Clear and Coherent and Normal Rate  ?Volume:  Normal  ?Mood:  Anxious, Depressed, and Irritable  ?Affect:  Congruent and Depressed  ?Thought Process:  Coherent, Goal Directed, and Descriptions of Associations: Intact  ?Orientation:  Full (Time,  Place, and Person)  ?Thought Content: WDL   ?Suicidal Thoughts:  No  ?Homicidal Thoughts:  No  ?Memory:  Immediate;   Good ?Recent;   Good ?Remote;   Fair  ?Judgement:  Fair  ?Insight:  Fair  ?Psychomotor Activity:  Restle

## 2022-05-04 ENCOUNTER — Other Ambulatory Visit: Payer: Self-pay

## 2022-06-28 ENCOUNTER — Telehealth (HOSPITAL_COMMUNITY): Payer: No Payment, Other | Admitting: Student in an Organized Health Care Education/Training Program

## 2022-06-28 DIAGNOSIS — F3164 Bipolar disorder, current episode mixed, severe, with psychotic features: Secondary | ICD-10-CM

## 2022-06-28 DIAGNOSIS — F4310 Post-traumatic stress disorder, unspecified: Secondary | ICD-10-CM

## 2022-06-28 DIAGNOSIS — F411 Generalized anxiety disorder: Secondary | ICD-10-CM

## 2022-06-28 NOTE — Progress Notes (Signed)
Virtual Visit via Video Note  I attempted to connect with Seth Mcintyre on 06/28/22 at  1:00 PM EDT by a video enabled telemedicine application, however, he did not attend the appointment.  The appointment will be rescheduled.   [name], MD

## 2022-12-05 ENCOUNTER — Other Ambulatory Visit: Payer: Self-pay

## 2023-03-05 ENCOUNTER — Ambulatory Visit (HOSPITAL_COMMUNITY)
Admission: EM | Admit: 2023-03-05 | Discharge: 2023-03-05 | Disposition: A | Discharge status: Home / Self Care | Payer: Medicaid Other | Attending: Psychiatry | Admitting: Psychiatry

## 2023-03-05 DIAGNOSIS — Z636 Dependent relative needing care at home: Secondary | ICD-10-CM | POA: Insufficient documentation

## 2023-03-05 DIAGNOSIS — F4310 Post-traumatic stress disorder, unspecified: Secondary | ICD-10-CM | POA: Insufficient documentation

## 2023-03-05 DIAGNOSIS — Z8659 Personal history of other mental and behavioral disorders: Secondary | ICD-10-CM

## 2023-03-05 DIAGNOSIS — F411 Generalized anxiety disorder: Secondary | ICD-10-CM

## 2023-03-05 DIAGNOSIS — F319 Bipolar disorder, unspecified: Secondary | ICD-10-CM | POA: Insufficient documentation

## 2023-03-05 NOTE — Discharge Instructions (Addendum)
[facility], [place] [address] [phone]  New patients are seen at their walk-in clinic. Walk-in hours are Monday - Friday from 8:30 am - 12:00 pm, and from 1:00 pm - 2:30 pm.   Walk-in patients are seen on a first come, first served basis, so try to arrive as early as possible for the best chance of being seen the same day.  Base on the information you have provided and the presenting issue, outpatient services and resources for have been recommended.  It is imperative that you follow through with treatment recommendations within 5-7 days from the of discharge to mitigate further risk to your safety and mental well-being. A list of referrals has been provided below to get you started.  You are not limited to the list provided.  In case of an urgent crisis, you may contact the Mobile Crisis Unit with [facility] at [phone].    Outpatient Services for Therapy and Medication Management for Medicaid  Based on what you have shared, a list of resources for outpatient therapy and psychiatry is provided below to get you started back on treatment.  It is imperative that you follow through with treatment within 5-7 days from the day of discharge to prevent any further risk to your safety or mental well-being.  You are not limited to the list provided.  In case of an urgent crisis, you may contact the Mobile Crisis Unit with [facility] at [phone].        [facility] [address] [place], [place], [address] [phone] phone  [facility], [place], [place], [address] [phone] phone [phone] phone ([address] AmeriHealth, Anthem/Elevance, BCBS, [place], [place], ComPsych, Healthy [place], [place], [place], [place], [place], Southern Company, [place], Out of Network)  [employer], [address] [place], [place], [address] [phone] phone ([place], Anthem/Elevance, [employer] Options/Carelon, [place], [place], [place], [place], [place], [employer], [place], [place], [place], [facility])  [facility] [address] [place], [place], [address] [phone] phone (Medicaid, ask about other insurance)  [facility] [address], [place], [place], [address] [phone] phone [phone] fax ([address] [place] , [place], [place], [place], UHC, [place]gr[employer] Pride, Self-Pay)  [address] [place], [place], [address] [phone] phone ([address] Anthem/Elevance, [place], [place], [place], [place], [place], [employer], [place], [place], [place], [facility])  [place] - 6-8 MONTH WAIT FOR THERAPY; [facility] [address], [place], [place], [address] [phone] phone ([address], [place], [place], [place], [place], Friday Health Plans, [place], [place], [place], [place], [place], [place], Tricare, UHC, The [employer] Companies, [place])  [facility] [address], [place], [place], [address] [phone] phone  [facility] [address] [place], [place], [address] [phone] phone  [facility] [address], [place], [place], [address] [phone] phone  [facility] [address] [place], [address] [phone] phone  [facility], [place] [address], [place], [address] [phone] phone  [facility] [place], [place], [address] [phone] phone [phone] fax  [facility] [address] [place], [place], [address] [phone]  phone [phone]  fax  [employer] [phone] [address] [place], [place], [address] [phone] phone  Jinny Blossom [address] [place], [place], [address]  [phone] phone  [facility] [place]  (Adults Only) [address] [place], [place], [address]  [phone] phone [phone] fax    Base on the information you have provided and the presenting issue, outpatient caregiver resources for the blind have been recommended.  It is imperative that you follow through with treatment recommendations within 5-7 days from the of discharge to mitigate further risk to your safety and mental well-being. A list of referrals has been provided below to get you started.  You are not limited to the list provided.  In case of an urgent crisis, you may contact the Mobile Crisis Unit with [facility] at [phone].   Marland KitchenMarland KitchenBase on the information you have provided and the presenting issue, outpatient services and resources for have been recommended.  It is imperative that you follow through with treatment recommendations within 5-7 days from the of discharge to mitigate further risk to your safety and mental well-being. A list of referrals has been provided below to get you started.  You are not limited to the list provided.  In case of an urgent crisis, you may contact the Mobile Crisis Unit with [facility] at [phone].   Support Groups In many communities groups have formed to provide support to people who have lost vision and to their family members. These groups focus on educational, recreational and social activities. We can provide contact information about these groups.  Contact To request our services, contact the [employer] nearest to you, or the [place]. Phone: [phone] or [phone]  The Communications Unit Phone: [phone] Spanish: [facility] We offer  comprehensive, quality services to assist you in adjusting to your vision loss and learning to live independently and safely in your home and community. Services include:  Adjustment to vision loss counseling and Equities trader Assistance with residing in the most independent setting possible Chartered loss adjuster Daily living skills classes provided in local communities by our [employer] Extensive daily living skills instruction Individual and family adjustment services Information and referral to other resources Low vision services Orientation and mobility (safe travel) training Eligibility You may be eligible for our services if you are:  [place] resident Struggling to find employment due to significant visual impairment Finding it difficult to remain independent due to significant visual impairment  How to Apply  To apply for services contact a Education officer, museum for the Blind or an Data processing manager at your local [employer].    Social Workers for the [place] in [employer] Phone: [phone] Fax: [phone] Email: [email or web address] [address] [place], [place] [address]    Elby Showers  Phone: [phone] Fax: [phone] Email: [email or web address] [address] [place], [place] [address]    Education and [employer] The Division of Services for the Blind (DSB) provides information on our services at public events. We can also provide education and awareness to family members, caregivers, civic groups, businesses, and other service providers on topics related to vision loss, including:  Low-vision aids, talking aids, and other adaptive aids to assist people with vision loss Technology available to assist people with vision loss Travel techniques for sighted people assisting people with  vision loss Handicapped Placard If we have  a current eye report on file, or if you can provide a report documenting your legal blindness, we can verify your eligibility for a Handicapped Parking Placard. Once verified, you can get an application for a handicap placard from your local Education officer, museum for the Blind or [employer].  [employer] We can assist you in applying for audio books, books in Braille or large print books from the [employer] for the Blind and Physically Handicapped. A descriptive video service is also available.       Client Assistance Program  (CAP) Established as part of the [place], as amended, the [employer] (CAP) is a federally funded program designed to assist individuals with disabilities in understanding and using rehabilitation services. CAP serves an integral part of the rehabilitation system by:  Advising and informing individuals of all services and benefits available to them through programs authorized under the Rehabilitation Act and under Title I of the Americans with Disabilities Act (ADA).   Assisting and advocating for individuals in their relationships with programs providing rehabilitation services under the Rehabilitation Act.  Helping to identify and resolve problems that may arise during the rehabilitation process.  Identifying problem areas in the delivery of rehabilitation services and making suggestions for improving services. CAP assists  Anyone seeking information, applying for or receiving services from a rehabilitation program including the Division of Vocational Rehabilitation Services, the Division of Services for the Blind, the Independent Living Rehabilitation Programs and the Centers for [place]. Persons who are dissatisfied with services they are receiving or who have been denied services for which they might be eligible. Persons needing intervention or  assistance in their relationships with programs providing rehabilitation services.   Client Assistance Program Services Advise people with disabilities of their rights under the [place], as amended and the Americans with Disabilities Act.  Explain the nature of the various services available to people with disabilities from the different state agency rehabilitation programs.  Refer people with non-rehabilitation needs to other appropriate resources.  Advise and interpret agency policies and procedures to consumers as they relate to the individual's rehabilitation choices.  Identify problems and solutions between consumers and agency staff.  Advocate for and represent consumers in the appeals process.  Recognize service delivery problems and recommend positive changes in the rehabilitation program rules and policies. Provide outreach to individuals with disabilities from diverse racial and cultural backgrounds.    Contact CAP if  CAP can be helpful at different points throughout the rehabilitation process. You are encouraged to contact CAP if: You have questions regarding the services available from a particular rehabilitation program. You have been determined ineligible for services and you disagree with the determination. You are experiencing undue delays in the processing of your application for eligibility with a rehabilitation program. You are experiencing delays in receiving services for which you have already been determined eligible. You have been refused services which you feel are needed to reach your individual rehabilitation goal. You are having problems with your rehabilitation program that you have not been able to work out with your counselor. You disagree with the termination of a rehabilitation service or the closure of your case record. Rights and Responsibilities As a rehabilitation program participant, you have certain rights and responsibilities that can  help maximize your success in the program. As a client, you have the following rights:  Apply or reapply for rehabilitation services. Receive a timely decision on  your eligibility for services based on a complete assessment of your disability. Receive an explanation in writing, should you be determined ineligible for services, indicating the reasons why you have been denied. Receive an explanation about services that may be available to you. Be a partner with your counselor in making informed choices for your rehabilitation plan. Be assured of complete confidentiality of your case record. Review your rehabilitation case record with a staff member present. Participate with your counselor in any decision to close your case. Appeal a decision with which you do not agree. Be informed of the Client Assistance Program. Be provided a form of communication appropriate to accommodate your disability. As a client, you have the following responsibilities:  Fully participate in your rehabilitation plan. Help set goals and participate in writing your rehabilitation plan with your counselor. Follow through with your plan for rehabilitation. Communicate with your counselor and ask questions when you do not understand services. Keep all appointments or let your counselor know beforehand if you cannot keep the appointment. Keep a copy of your plan and any amendments related to the plan. Notify your counselor of any change in your personal or work status. Be aware of financial eligibility requirements for some services. Client Engineer, civil (consulting) Fact Sheet Client Assistance Program Brochure Client Assistance Brochure (Spanish) Understanding the Rehabilitation Process Understanding the Rehabilitation Process ([place])  Meet the Staff Lianne Moris, Director Darl Householder, Client Advocate Cathren Harsh, Processing Assistant  Contact us Mailing Address [place], [place] [address]  Phone and Fax [phone] Toll-Free: [phone] Fax: [phone] Email: [email or web address]

## 2023-03-05 NOTE — ED Notes (Signed)
Patient discharged with written and verbal instructions by provider Dr. Earley Favor. Resources given.

## 2023-03-05 NOTE — ED Triage Notes (Signed)
Pt presents to [facility] voluntarily seeking therapy resources. Pt reports he has been experiencing increased stress due to his wife becoming blind, becoming her full time caregiver, financial issues,caring for his wifes 6 dogs and her 2 grandchildren and lack of support system. Pt reports passive HI towards his wife's family because they are demanding but are not supportive with his wife. Pt denies SI but states that if something were to happen he would not mind. Pt reports visual hallucinations at times when he is stressed at home. Pt reports increased anger outbursts, decreased sleep, decreased appetite. denies AVH at this time. Pt denies NSSIB, drug or alcohol use.
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
## 2023-04-21 LAB — AMB RESULTS CONSOLE CBG: Glucose: 111

## 2023-05-08 ENCOUNTER — Emergency Department (HOSPITAL_COMMUNITY)
Admission: EM | Admit: 2023-05-08 | Discharge: 2023-05-08 | Disposition: A | Discharge status: Home / Self Care | Payer: 59 | Attending: Emergency Medicine | Admitting: Emergency Medicine

## 2023-05-08 ENCOUNTER — Other Ambulatory Visit: Payer: Self-pay

## 2023-05-08 DIAGNOSIS — F1210 Cannabis abuse, uncomplicated: Secondary | ICD-10-CM | POA: Insufficient documentation

## 2023-05-08 DIAGNOSIS — R45851 Suicidal ideations: Secondary | ICD-10-CM

## 2023-05-08 DIAGNOSIS — F1014 Alcohol abuse with alcohol-induced mood disorder: Secondary | ICD-10-CM | POA: Diagnosis not present

## 2023-05-08 DIAGNOSIS — F1094 Alcohol use, unspecified with alcohol-induced mood disorder: Secondary | ICD-10-CM | POA: Diagnosis not present

## 2023-05-08 DIAGNOSIS — F411 Generalized anxiety disorder: Secondary | ICD-10-CM | POA: Diagnosis present

## 2023-05-08 DIAGNOSIS — R9431 Abnormal electrocardiogram [ECG] [EKG]: Secondary | ICD-10-CM | POA: Diagnosis not present

## 2023-05-08 DIAGNOSIS — Y905 Blood alcohol level of 100-119 mg/100 ml: Secondary | ICD-10-CM | POA: Insufficient documentation

## 2023-05-08 LAB — RAPID URINE DRUG SCREEN, HOSP PERFORMED
Amphetamines: NOT DETECTED
Barbiturates: NOT DETECTED
Benzodiazepines: POSITIVE — AB
Cocaine: NOT DETECTED
Opiates: NOT DETECTED
Tetrahydrocannabinol: POSITIVE — AB

## 2023-05-08 LAB — CBC WITH DIFFERENTIAL/PLATELET
Abs Immature Granulocytes: 0.01 10*3/uL (ref 0.00–0.07)
Basophils Absolute: 0.1 10*3/uL (ref 0.0–0.1)
Basophils Relative: 1 %
Eosinophils Absolute: 0.1 10*3/uL (ref 0.0–0.5)
Eosinophils Relative: 2 %
HCT: 43.8 % (ref 39.0–52.0)
Hemoglobin: 14.8 g/dL (ref 13.0–17.0)
Immature Granulocytes: 0 %
Lymphocytes Relative: 37 %
Lymphs Abs: 1.8 10*3/uL (ref 0.7–4.0)
MCH: 31.9 pg (ref 26.0–34.0)
MCHC: 33.8 g/dL (ref 30.0–36.0)
MCV: 94.4 fL (ref 80.0–100.0)
Monocytes Absolute: 0.3 10*3/uL (ref 0.1–1.0)
Monocytes Relative: 6 %
Neutro Abs: 2.6 10*3/uL (ref 1.7–7.7)
Neutrophils Relative %: 54 %
Platelets: 261 10*3/uL (ref 150–400)
RBC: 4.64 MIL/uL (ref 4.22–5.81)
RDW: 11.9 % (ref 11.5–15.5)
WBC: 4.8 10*3/uL (ref 4.0–10.5)
nRBC: 0 % (ref 0.0–0.2)

## 2023-05-08 LAB — COMPREHENSIVE METABOLIC PANEL
ALT: 42 U/L (ref 0–44)
AST: 33 U/L (ref 15–41)
Albumin: 4.5 g/dL (ref 3.5–5.0)
Alkaline Phosphatase: 49 U/L (ref 38–126)
Anion gap: 13 (ref 5–15)
BUN: 8 mg/dL (ref 6–20)
CO2: 26 mmol/L (ref 22–32)
Calcium: 9.3 mg/dL (ref 8.9–10.3)
Chloride: 101 mmol/L (ref 98–111)
Creatinine, Ser: 1.08 mg/dL (ref 0.61–1.24)
GFR, Estimated: >60 mL/min (ref >60)
Glucose, Bld: 93 mg/dL (ref 70–99)
Potassium: 3.7 mmol/L (ref 3.5–5.1)
Sodium: 140 mmol/L (ref 135–145)
Total Bilirubin: 0.9 mg/dL (ref 0.3–1.2)
Total Protein: 7.9 g/dL (ref 6.5–8.1)

## 2023-05-08 LAB — ETHANOL: Alcohol, Ethyl (B): 108 mg/dL — ABNORMAL HIGH (ref <10)

## 2023-05-08 MED ORDER — ZIPRASIDONE MESYLATE 20 MG IM SOLR: 20.0000 mg | Freq: Once | INTRAMUSCULAR | Status: DC | PRN

## 2023-05-08 NOTE — ED Notes (Signed)
Patient remains calm in hall with [employer] and security at bedside Pt pending TTS is IVC

## 2023-05-08 NOTE — ED Notes (Signed)
Patient offered food and drink does not want anything at this time

## 2023-05-08 NOTE — Discharge Instructions (Signed)
**   Please go to [facility] at 7 AM tomorrow morning for walk in hours for medication adjustments/management**   Outpatient psychiatric Services  Walk in hours for medication management Monday, Wednesday, Thursday, and Friday from 8:00 AM to 11:00 AM Recommend arriving by by 7:30 AM.  It is first come first serve.    Walk in hours for therapy intake Monday and Wednesday only 8:00 AM to 11:00 AM Encouraged to arrive by 7:30 AM.  It is first come first serve   Inpatient patient psychiatric services [facility] offers comprehensive behavioral heath care services for mental health and substance abuse treatment.  Social work can also assist with referral to or getting you into a rehabilitation program short or long term
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
## 2023-05-08 NOTE — ED Notes (Signed)
Pt resting on stretcher. Police officers at bedside. Pt ignored this RN's greeting. Reg diet ordered. Waiting for TTS consult. IVC paperwork with this RN at the desk.
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
## 2023-05-08 NOTE — ED Notes (Signed)
Patient remains calm in presence of law enforcement and security still unwilling to change into purple clothes

## 2023-05-08 NOTE — ED Provider Notes (Signed)
Patient was initially seen by Dr. Eudelia Bunch.  Please see his note.  Patient was evaluated by psychiatry, NP Effie Shy.  They do not feel that he requires inpatient treatment and is appropriate for outpatient management   Linwood Dibbles, MD 05/08/23 [phone]
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
## 2023-05-08 NOTE — ED Notes (Signed)
IVC Paperwork completed and at the [employer].

## 2023-05-08 NOTE — ED Notes (Signed)
Patient sitting in bed now cooperating with [employer] and security urine sample collected and sent, Patient states he only wants a dark skin person drawing blood phlebotomy will handle that momentarily. ED NT to get EKG shortly. Pt refuses to change into purple scrubs not going to push the issue at present time

## 2023-05-08 NOTE — ED Notes (Signed)
Pt quiet and asking what the plan is for him. Explained he needs to do his TTS consult. Wants to get that done "so he can get out of here". Brought the pt some coffee, per his request, and a breakfast tray was brought to him. Sitter at bedside. Police officers have left. Cell phone and wallet at nurses' desk. Will have security pick up and secure.
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
## 2023-05-09 ENCOUNTER — Ambulatory Visit (HOSPITAL_COMMUNITY)
Admission: EM | Admit: 2023-05-09 | Discharge: 2023-05-09 | Disposition: A | Discharge status: Other Acute Inpatient Hospital | Payer: Medicaid Other

## 2023-05-09 ENCOUNTER — Ambulatory Visit (HOSPITAL_COMMUNITY)
Admission: EM | Admit: 2023-05-09 | Discharge: 2023-05-09 | Disposition: A | Discharge status: Home / Self Care | Payer: Medicaid Other | Attending: Psychiatry | Admitting: Psychiatry

## 2023-05-09 DIAGNOSIS — Z7689 Persons encountering health services in other specified circumstances: Secondary | ICD-10-CM

## 2023-05-09 DIAGNOSIS — Z638 Other specified problems related to primary support group: Secondary | ICD-10-CM | POA: Insufficient documentation
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
## 2023-05-09 NOTE — Discharge Instructions (Addendum)
Please come to [facility] (this facility, SECOND FLOOR) during walk in hours for appointment with psychiatrist/provider for further medication management and for therapists for therapy.   Walk in hours for therapy/counseling: Monday, Wednesday, Thursday 7:30AM until slots are full  Walk in hours for psychiatry/medication management: Monday through Friday 7:30AM until slots are full.   When you arrive please take the elevators upstairs. If you are unsure of where to go, inform the front desk that you are here for open access hours and they will assist you with directions upstairs.  Walk ins spots are limited and are seen first come, first served. YOU MAY NOT BE SEEN THE SAME DAY YOU ARRIVE. To increase the likelihood of being seen the same day, please arrive early, such as by 7:00AM.  Address:  [address]OND [place], in [place], [address] Ph: [phone]
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
## 2023-05-10 ENCOUNTER — Ambulatory Visit (HOSPITAL_COMMUNITY): Payer: Medicaid Other | Admitting: Mental Health

## 2023-05-10 ENCOUNTER — Ambulatory Visit (INDEPENDENT_AMBULATORY_CARE_PROVIDER_SITE_OTHER): Payer: Medicaid Other | Admitting: Mental Health

## 2023-05-10 DIAGNOSIS — F3164 Bipolar disorder, current episode mixed, severe, with psychotic features: Secondary | ICD-10-CM | POA: Diagnosis not present

## 2023-05-10 DIAGNOSIS — F4310 Post-traumatic stress disorder, unspecified: Secondary | ICD-10-CM
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
## 2023-05-11 ENCOUNTER — Encounter (HOSPITAL_COMMUNITY): Payer: Self-pay | Admitting: Student in an Organized Health Care Education/Training Program

## 2023-05-11 ENCOUNTER — Other Ambulatory Visit: Payer: Self-pay

## 2023-05-11 ENCOUNTER — Ambulatory Visit (INDEPENDENT_AMBULATORY_CARE_PROVIDER_SITE_OTHER): Payer: Medicaid Other | Admitting: Student in an Organized Health Care Education/Training Program

## 2023-05-11 VITALS — BP 139/89 | HR 66 | Ht 69.0 in | Wt 201.4 lb

## 2023-05-11 DIAGNOSIS — F3163 Bipolar disorder, current episode mixed, severe, without psychotic features: Secondary | ICD-10-CM | POA: Diagnosis not present

## 2023-05-11 DIAGNOSIS — F4310 Post-traumatic stress disorder, unspecified: Secondary | ICD-10-CM | POA: Diagnosis not present

## 2023-05-11 DIAGNOSIS — F411 Generalized anxiety disorder: Secondary | ICD-10-CM | POA: Diagnosis not present

## 2023-05-11 MED ORDER — OLANZAPINE 5 MG PO TABS
5.0000 mg | ORAL_TABLET | Freq: Every day | ORAL | 1 refills | Status: DC
Start: 2023-05-11 — End: 2024-01-07
  Filled 2023-05-11: qty 30, 30d supply, fill #0
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
## 2023-05-11 NOTE — Progress Notes (Deleted)
Psychiatric Initial Adult Assessment   Patient Identification: Seth Mcintyre MRN:  [id number] Date of Evaluation:  05/11/2023 Referral Source: *** Chief Complaint:  No chief complaint on file.  Visit Diagnosis: No diagnosis found.  History of Present Illness:  ***  Associated Signs/Symptoms: Depression Symptoms:  {DEPRESSION SYMPTOMS:20000} (Hypo) Manic Symptoms:  {BHH MANIC SYMPTOMS:22872} Anxiety Symptoms:  {BHH ANXIETY SYMPTOMS:22873} Psychotic Symptoms:  {BHH PSYCHOTIC SYMPTOMS:22874} PTSD Symptoms: {BHH PTSD SYMPTOMS:22875}  Past Psychiatric History: ***  Previous Psychotropic Medications: {YES/NO:21197}  Substance Abuse History in the last 12 months:  {yes no:314532}  Consequences of Substance Abuse: {BHH CONSEQUENCES OF SUBSTANCE ABUSE:22880}  Past Medical History: No past medical history on file.  Past Surgical History:  Procedure Laterality Date   APPENDECTOMY      Family Psychiatric History: ***  Family History:  Family History  Family history unknown: Yes    Social History:   Social History   Socioeconomic History   Marital status: Married    Spouse name: Metallurgist   Number of children: Not on file   Years of education: Not on file   Highest education level: Not on file  Occupational History   Not on file  Tobacco Use   Smoking status: Never   Smokeless tobacco: Never  Vaping Use   Vaping Use: Never used  Substance and Sexual Activity   Alcohol use: Yes    Alcohol/week: 8.0 standard drinks of alcohol    Types: 8 Cans of beer per week   Drug use: Yes    Types: Marijuana   Sexual activity: Yes  Other Topics Concern   Not on file  Social History Narrative   Not on file   Social Determinants of Health   Financial Resource Strain: Low Risk  (09/30/2019)   Overall Financial Resource Strain (CARDIA)    Difficulty of Paying Living Expenses: Not hard at all  Food Insecurity: No Food Insecurity (04/24/2023)   Hunger Vital Sign     Worried About Running Out of Food in the Last Year: Never true    Ran Out of Food in the Last Year: Never true  Transportation Needs: No Transportation Needs (04/24/2023)   PRAPARE - Administrator, Civil Service (Medical): No    Lack of Transportation (Non-Medical): No  Physical Activity: Insufficiently Active (09/30/2019)   Exercise Vital Sign    Days of Exercise per Week: 2 days    Minutes of Exercise per Session: 30 min  Stress: No Stress Concern Present (09/30/2019)   [employer] of Occupational Health - Occupational Stress Questionnaire    Feeling of Stress : Not at all  Social Connections: Moderately Isolated (05/10/2023)   Social Connection and Isolation Panel [NHANES]    Frequency of Communication with Friends and Family: Once a week    Frequency of Social Gatherings with Friends and Family: Never    Attends Religious Services: More than 4 times per year    Active Member of [employer] or Organizations: No    Attends Engineer, structural: Never    Marital Status: Married    Additional Social History: ***  Allergies:  No Known Allergies  Metabolic Disorder Labs: No results found for: "HGBA1C", "MPG" No results found for: "PROLACTIN" Lab Results  Component Value Date   TRIG 231 (H) 09/07/2018   No results found for: "TSH"  Therapeutic Level Labs: No results found for: "LITHIUM" No results found for: "CBMZ" No results found for: "VALPROATE"  Current Medications: No current outpatient medications  on file.   No current facility-administered medications for this visit.    Musculoskeletal: Strength & Muscle Tone: {desc; muscle tone:32375} Gait & Station: {PE GAIT ED ZOXW:96045} Patient leans: {Patient Leans:21022755}  Psychiatric Specialty Exam: Review of Systems  There were no vitals taken for this visit.There is no height or weight on file to calculate BMI.  General Appearance: {Appearance:22683}  Eye Contact:  {BHH EYE CONTACT:22684}   Speech:  {Speech:22685}  Volume:  {Volume (PAA):22686}  Mood:  {BHH MOOD:22306}  Affect:  {Affect (PAA):22687}  Thought Process:  {Thought Process (PAA):22688}  Orientation:  {BHH ORIENTATION (PAA):22689}  Thought Content:  {Thought Content:22690}  Suicidal Thoughts:  {ST/HT (PAA):22692}  Homicidal Thoughts:  {ST/HT (PAA):22692}  Memory:  {BHH MEMORY:22881}  Judgement:  {Judgement (PAA):22694}  Insight:  {Insight (PAA):22695}  Psychomotor Activity:  {Psychomotor (PAA):22696}  Concentration:  {Concentration:21399}  Recall:  {BHH GOOD/FAIR/POOR:22877}  Fund of Knowledge:{BHH GOOD/FAIR/POOR:22877}  Language: {BHH GOOD/FAIR/POOR:22877}  Akathisia:  {BHH YES OR NO:22294}  Handed:  {Handed:22697}  AIMS (if indicated):  {Desc; done/not:10129}  Assets:  {Assets (PAA):22698}  ADL's:  {BHH WUJ'W:11914}  Cognition: {chl bhh cognition:304700322}  Sleep:  {BHH GOOD/FAIR/POOR:22877}   Screenings: AUDIT    Advertising copywriter from 05/10/2023 in [facility]  Alcohol Use Disorder Identification Test Final Score (AUDIT) 5      GAD-7    Flowsheet Row Counselor from 05/10/2023 in [facility] Video Visit from 04/26/2022 in [facility] Video Visit from 02/22/2022 in [facility] Video Visit from 12/28/2021 in [facility] Office Visit from 06/29/2021 in [facility]  Total GAD-7 Score 20 21 21 21 21       PHQ2-9    Flowsheet Row Counselor from 05/10/2023 in [facility] Video Visit from 04/26/2022 in [facility] Video Visit from 02/22/2022 in [facility] Video Visit from 12/28/2021 in [facility] Office Visit from 08/30/2021 in [facility]  PHQ-2 Total Score 6 6 6 6 6   PHQ-9 Total Score 21 27  25 26 23       Flowsheet Row Counselor from 05/10/2023 in [facility] ED from 05/09/2023 in [facility] ED from 03/05/2023 in [facility]  C-SSRS RISK CATEGORY No Risk No Risk No Risk       Assessment and Plan: ***  Collaboration of Care: {BH OP Collaboration of Care:21014065}  Patient/Guardian was advised Release of Information must be obtained prior to any record release in order to collaborate their care with an outside provider. Patient/Guardian was advised if they have not already done so to contact the registration department to sign all necessary forms in order for [place] to release information regarding their care.   Consent: Patient/Guardian gives verbal consent for treatment and assignment of benefits for services provided during this visit. Patient/Guardian expressed understanding and agreed to proceed.   [name], MD 5/24/20247:57 AM

## 2023-05-17 ENCOUNTER — Encounter: Payer: Self-pay | Admitting: *Deleted

## 2023-05-17 NOTE — Progress Notes (Unsigned)
Called to see if a PCP is needed, seeing recent history of E/R visits. VM left. PT. Attended the 05/10/2023 health equity screening.B/P 172/98. Per PT. Chart, Pt. Has been frequently seen at the E/R. With general practice noted.  A follow up letter was mailed on 05/17/2023 along with a PCP resource. No other health eq./SDOH needs are requested at this time. A follow up call will be made within the next 3 months or before.

## 2023-06-14 ENCOUNTER — Ambulatory Visit (INDEPENDENT_AMBULATORY_CARE_PROVIDER_SITE_OTHER): Payer: 59 | Admitting: Mental Health

## 2023-06-14 ENCOUNTER — Encounter (HOSPITAL_COMMUNITY): Payer: Self-pay

## 2023-06-14 DIAGNOSIS — F3164 Bipolar disorder, current episode mixed, severe, with psychotic features: Secondary | ICD-10-CM | POA: Diagnosis not present

## 2023-06-14 DIAGNOSIS — F4310 Post-traumatic stress disorder, unspecified: Secondary | ICD-10-CM

## 2023-06-14 NOTE — Progress Notes (Unsigned)
   THERAPIST PROGRESS NOTE  Session Time: 4:00 pm (58 minutes)  Participation Level: Active  Behavioral Response: CasualAlertIrritable  Type of Therapy: Individual Therapy  Treatment Goals addressed: STG: "I'm angry. I am hurt." [name] will increase management of moods(depression/irritability) AEB development of x 3 effective coping skills with ability to process thoughts and feelings in healthy effective manner per self report within the next 90 days.   ProgressTowards Goals: Initial  Interventions: Supportive  Summary: Seth Mcintyre is a 57 y.o. male who presents with dx of Bipolar disorder current mixed with psychotic features; PTSD. Presents alert and oriented; mood and affect irritable, angry. Speech clear and coherent at normal rate and tone. Engaged and receptive to interventions. Chief complaitn of feelings of irritablity and anger towards current circumstances. Shares thoughts of wife and her children to hate him, "they hate me that much." Shares was unable to obtain his belongings even with police escort and gave him items in which he no longer wears and should be sent to [facility]. 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 Denies to feel as if he can trust others. Engaged with therapist to process feelings and working to not respond to others out of emotions and ability to cope and process thoughts prior to a behavioral reaction. Notes thoughts of Eddie vs. Joffrey and his demeanor as well as presence of auditory hallucinations. Shares thus far medication adherence. Initial work towards goals; sxs unchanged at this time.   Suicidal/Homicidal: Nowithout intent/plan  Therapist Response: Therapist engaged [name] in therapy session. Reviewed previous intake session. Engaged in rapport building. Provided safe space for [name] to share thoughts and feelings in regards to marriage and working to re-establish  himself. Provided supportive feedback; validated feelings. Assessed for level of sxs and ability to cope. Validated concerns for trust. Engaged in education on responding out of pure emotions vs. Allowing self to work though emotions and making behavioral choices he may regret. Encouraged communicating to others in assertive manner to not want to hear concerns or information about wife and work to allow self to process emotions. Encouraged delayed response to allow self to self-soothe to cope. Discussed treatment plan and reviewed session. Provided follow up. Assessed for safety.   Plan: Return again in  x 4 weeks.  Diagnosis: Bipolar disorder, current episode mixed, severe, with psychotic features (HCC)  PTSD (post-traumatic stress disorder)  Collaboration of Care: Other None  Patient/Guardian was advised Release of Information must be obtained prior to any record release in order to collaborate their care with an outside provider. Patient/Guardian was advised if they have not already done so to contact the registration department to sign all necessary forms in order for [place] to release information regarding their care.   Consent: Patient/Guardian gives verbal consent for treatment and assignment of benefits for services provided during this visit. Patient/Guardian expressed understanding and agreed to proceed.   Stephan Minister [place], [facility] 06/15/2023

## 2023-06-28 ENCOUNTER — Ambulatory Visit (HOSPITAL_COMMUNITY): Payer: 59 | Admitting: Mental Health

## 2023-06-29 ENCOUNTER — Encounter (HOSPITAL_COMMUNITY): Payer: 59 | Admitting: Student

## 2023-07-12 ENCOUNTER — Encounter: Payer: Self-pay | Admitting: *Deleted

## 2023-07-12 NOTE — Progress Notes (Addendum)
Pt attended 04/21/23 screening event where his b/p was 172/98 and his blood sugar was 111. Pt did not identify any SDOH at the event and a health equity team member attempted to contact pt for initial follow up, also sending a letter with PCP to him. During the 2nd event f/u, chart review indicates pt may still not have established care with a PCP but does have ongoing BH counseling appts, the most recent being 06/14/23 and a future appt on 07/26/23. During his 05/11/23 appt, his b/p was noted to be 139/89. Health equity caller unable to contact pt by phone again, so in-basket message sent to pt's ongoing BH counselor to let her know there is no indication in [facility] that pt has connected to a PCP bc pt has a future appt with Stephan Minister, [facility], on 07/26/2023. Also , 2nd letter sent to pt with [facility] PCP options.

## 2023-07-26 ENCOUNTER — Ambulatory Visit (HOSPITAL_COMMUNITY): Payer: MEDICAID | Admitting: Mental Health

## 2023-07-26 ENCOUNTER — Encounter (HOSPITAL_COMMUNITY): Payer: Self-pay

## 2023-07-26 ENCOUNTER — Telehealth (HOSPITAL_COMMUNITY): Payer: Self-pay | Admitting: Mental Health

## 2023-07-26 NOTE — Telephone Encounter (Signed)
Therapist contacted pt to attempt to engage in outpatient therapy virtual appointment. Straight to voicemail. Left message to reschedule OPT appointment.

## 2023-11-01 ENCOUNTER — Encounter: Payer: Self-pay | Admitting: *Deleted

## 2023-11-01 NOTE — Progress Notes (Signed)
Pt attended 04/21/23 screening event where his b/p was 172/98 and his blood sugar was 111. Pt did not identify any SDOH at the event and a health equity team member attempted to contact pt for initial follow up, also sending a letter with PCP to him. During the 2nd event f/u, chart review indicates pt may still not have established care with a PCP but does have ongoing BH counseling appts, the most recent appt on 07/26/23. During his 05/11/23 appt, his b/p was noted to be 139/89. Health equity caller unable to contact pt by phone again, so in-basket message sent to pt's ongoing [facility] counselor (with whom pt had appt on 06/14/23)  to let her know there is no indication in [facility] that pt has connected to a PCP. Last BH appt in [facility] was on 07/26/2023 but unable to tell with [facility] security if pt kept this app.  2nd letter sent to pt with [facility] PCP options. 07/18/23 response from [facility] provider Samara Deist): "Thank you for making me aware. I will follow up with him in this regard. To my knowledge he does not already have an established PCP but this is something we have discussed in the past. Once seen I will alert you of his plans." Per current chart review, no PCP appt visible in [facility] since letters sent or BH seen. Current chart address lists [facility] as pt's mailing address. Final letters sent to address from event listing with PCP info. No additional health equity team support scheduled at this time.

## 2024-01-07 ENCOUNTER — Other Ambulatory Visit: Payer: Self-pay

## 2024-01-07 ENCOUNTER — Inpatient Hospital Stay (HOSPITAL_COMMUNITY)
Admission: AD | Admit: 2024-01-07 | Discharge: 2024-01-16 | DRG: 885 | Disposition: A | Discharge status: Home / Self Care | Payer: 59 | Source: Intra-hospital | Attending: Psychiatry | Admitting: Psychiatry

## 2024-01-07 ENCOUNTER — Ambulatory Visit (HOSPITAL_COMMUNITY)
Admission: EM | Admit: 2024-01-07 | Discharge: 2024-01-07 | Disposition: A | Discharge status: Other Acute Inpatient Hospital | Payer: 59 | Attending: Addiction Medicine | Admitting: Addiction Medicine

## 2024-01-07 DIAGNOSIS — F3181 Bipolar II disorder: Secondary | ICD-10-CM | POA: Diagnosis present

## 2024-01-07 DIAGNOSIS — T43595A Adverse effect of other antipsychotics and neuroleptics, initial encounter: Secondary | ICD-10-CM | POA: Diagnosis not present

## 2024-01-07 DIAGNOSIS — F3162 Bipolar disorder, current episode mixed, moderate: Secondary | ICD-10-CM

## 2024-01-07 DIAGNOSIS — Z79899 Other long term (current) drug therapy: Secondary | ICD-10-CM

## 2024-01-07 DIAGNOSIS — Y92239 Unspecified place in hospital as the place of occurrence of the external cause: Secondary | ICD-10-CM | POA: Diagnosis not present

## 2024-01-07 DIAGNOSIS — F419 Anxiety disorder, unspecified: Secondary | ICD-10-CM | POA: Insufficient documentation

## 2024-01-07 DIAGNOSIS — I10 Essential (primary) hypertension: Secondary | ICD-10-CM | POA: Diagnosis present

## 2024-01-07 DIAGNOSIS — Z59819 Housing instability, housed unspecified: Secondary | ICD-10-CM

## 2024-01-07 DIAGNOSIS — F4312 Post-traumatic stress disorder, chronic: Secondary | ICD-10-CM | POA: Diagnosis not present

## 2024-01-07 DIAGNOSIS — R4587 Impulsiveness: Secondary | ICD-10-CM | POA: Diagnosis not present

## 2024-01-07 DIAGNOSIS — R45851 Suicidal ideations: Secondary | ICD-10-CM | POA: Diagnosis not present

## 2024-01-07 DIAGNOSIS — F12159 Cannabis abuse with psychotic disorder, unspecified: Secondary | ICD-10-CM | POA: Diagnosis present

## 2024-01-07 DIAGNOSIS — F3163 Bipolar disorder, current episode mixed, severe, without psychotic features: Principal | ICD-10-CM | POA: Diagnosis present

## 2024-01-07 DIAGNOSIS — R253 Fasciculation: Secondary | ICD-10-CM | POA: Diagnosis not present

## 2024-01-07 DIAGNOSIS — Z9151 Personal history of suicidal behavior: Secondary | ICD-10-CM | POA: Diagnosis not present

## 2024-01-07 DIAGNOSIS — F1290 Cannabis use, unspecified, uncomplicated: Secondary | ICD-10-CM | POA: Diagnosis not present

## 2024-01-07 DIAGNOSIS — F1210 Cannabis abuse, uncomplicated: Secondary | ICD-10-CM | POA: Diagnosis not present

## 2024-01-07 DIAGNOSIS — Z5948 Other specified lack of adequate food: Secondary | ICD-10-CM

## 2024-01-07 DIAGNOSIS — Z62810 Personal history of physical and sexual abuse in childhood: Secondary | ICD-10-CM | POA: Diagnosis not present

## 2024-01-07 DIAGNOSIS — M19041 Primary osteoarthritis, right hand: Secondary | ICD-10-CM | POA: Diagnosis not present

## 2024-01-07 DIAGNOSIS — G479 Sleep disorder, unspecified: Secondary | ICD-10-CM | POA: Diagnosis not present

## 2024-01-07 DIAGNOSIS — R443 Hallucinations, unspecified: Secondary | ICD-10-CM | POA: Insufficient documentation

## 2024-01-07 DIAGNOSIS — Z5902 Unsheltered homelessness: Secondary | ICD-10-CM | POA: Diagnosis not present

## 2024-01-07 DIAGNOSIS — R519 Headache, unspecified: Secondary | ICD-10-CM | POA: Diagnosis not present

## 2024-01-07 DIAGNOSIS — F1010 Alcohol abuse, uncomplicated: Secondary | ICD-10-CM | POA: Diagnosis not present

## 2024-01-07 DIAGNOSIS — R45850 Homicidal ideations: Secondary | ICD-10-CM | POA: Diagnosis present

## 2024-01-07 DIAGNOSIS — Z5941 Food insecurity: Secondary | ICD-10-CM | POA: Diagnosis not present

## 2024-01-07 DIAGNOSIS — F1090 Alcohol use, unspecified, uncomplicated: Secondary | ICD-10-CM

## 2024-01-07 HISTORY — DX: Personal history of other mental and behavioral disorders: Z86.59

## 2024-01-07 HISTORY — DX: Post-traumatic stress disorder, unspecified: F43.10

## 2024-01-07 HISTORY — DX: Bipolar disorder, unspecified: F31.9

## 2024-01-07 HISTORY — DX: Homicidal ideations: R45.850

## 2024-01-07 HISTORY — DX: Suicidal ideations: R45.851

## 2024-01-07 LAB — POCT URINE DRUG SCREEN - MANUAL ENTRY (I-SCREEN)
POC Amphetamine UR: NOT DETECTED
POC Buprenorphine (BUP): NOT DETECTED
POC Cocaine UR: NOT DETECTED
POC Marijuana UR: POSITIVE — AB
POC Methadone UR: NOT DETECTED
POC Methamphetamine UR: NOT DETECTED
POC Morphine: NOT DETECTED
POC Oxazepam (BZO): NOT DETECTED
POC Oxycodone UR: NOT DETECTED
POC Secobarbital (BAR): NOT DETECTED

## 2024-01-07 LAB — URINALYSIS, ROUTINE W REFLEX MICROSCOPIC
Bilirubin Urine: NEGATIVE
Glucose, UA: NEGATIVE mg/dL
Hgb urine dipstick: NEGATIVE
Ketones, ur: NEGATIVE mg/dL
Leukocytes,Ua: NEGATIVE
Nitrite: NEGATIVE
Protein, ur: NEGATIVE mg/dL
Specific Gravity, Urine: 1.006 (ref 1.005–1.030)
pH: 6 (ref 5.0–8.0)

## 2024-01-07 LAB — RAPID URINE DRUG SCREEN, HOSP PERFORMED
Amphetamines: NOT DETECTED
Barbiturates: NOT DETECTED
Benzodiazepines: NOT DETECTED
Cocaine: NOT DETECTED
Opiates: NOT DETECTED
Tetrahydrocannabinol: POSITIVE — AB

## 2024-01-07 LAB — COMPREHENSIVE METABOLIC PANEL
ALT: 40 U/L (ref 0–44)
AST: 25 U/L (ref 15–41)
Albumin: 4.2 g/dL (ref 3.5–5.0)
Alkaline Phosphatase: 42 U/L (ref 38–126)
Anion gap: 10 (ref 5–15)
BUN: 8 mg/dL (ref 6–20)
CO2: 28 mmol/L (ref 22–32)
Calcium: 9 mg/dL (ref 8.9–10.3)
Chloride: 97 mmol/L — ABNORMAL LOW (ref 98–111)
Creatinine, Ser: 0.98 mg/dL (ref 0.61–1.24)
GFR, Estimated: >60 mL/min (ref >60)
Glucose, Bld: 91 mg/dL (ref 70–99)
Potassium: 3.5 mmol/L (ref 3.5–5.1)
Sodium: 135 mmol/L (ref 135–145)
Total Bilirubin: 0.6 mg/dL (ref 0.0–1.2)
Total Protein: 7.1 g/dL (ref 6.5–8.1)

## 2024-01-07 LAB — CBC WITH DIFFERENTIAL/PLATELET
Abs Immature Granulocytes: 0 10*3/uL (ref 0.00–0.07)
Basophils Absolute: 0 10*3/uL (ref 0.0–0.1)
Basophils Relative: 1 %
Eosinophils Absolute: 0.1 10*3/uL (ref 0.0–0.5)
Eosinophils Relative: 2 %
HCT: 39.7 % (ref 39.0–52.0)
Hemoglobin: 13.4 g/dL (ref 13.0–17.0)
Immature Granulocytes: 0 %
Lymphocytes Relative: 40 %
Lymphs Abs: 1.8 10*3/uL (ref 0.7–4.0)
MCH: 31.1 pg (ref 26.0–34.0)
MCHC: 33.8 g/dL (ref 30.0–36.0)
MCV: 92.1 fL (ref 80.0–100.0)
Monocytes Absolute: 0.4 10*3/uL (ref 0.1–1.0)
Monocytes Relative: 8 %
Neutro Abs: 2.2 10*3/uL (ref 1.7–7.7)
Neutrophils Relative %: 49 %
Platelets: 281 10*3/uL (ref 150–400)
RBC: 4.31 MIL/uL (ref 4.22–5.81)
RDW: 11.2 % — ABNORMAL LOW (ref 11.5–15.5)
WBC: 4.5 10*3/uL (ref 4.0–10.5)
nRBC: 0 % (ref 0.0–0.2)

## 2024-01-07 LAB — MAGNESIUM: Magnesium: 2.1 mg/dL (ref 1.7–2.4)

## 2024-01-07 LAB — ETHANOL: Alcohol, Ethyl (B): <10 mg/dL (ref <10)

## 2024-01-07 LAB — HEMOGLOBIN A1C
Hgb A1c MFr Bld: 5 % (ref 4.8–5.6)
Mean Plasma Glucose: 96.8 mg/dL

## 2024-01-07 MED ORDER — LORAZEPAM 2 MG/ML IJ SOLN: 2.0000 mg | Freq: Three times a day (TID) | INTRAMUSCULAR | Status: DC | PRN

## 2024-01-07 MED ORDER — PRAZOSIN HCL 2 MG PO CAPS
2.0000 mg | ORAL_CAPSULE | Freq: Every day | ORAL | Status: DC
  Administered 2024-01-07 – 2024-01-15 (×9): 2 mg via ORAL
  Filled 2024-01-07 (×2): qty 1
  Filled 2024-01-07: qty 2
  Filled 2024-01-07 (×9): qty 1

## 2024-01-07 MED ORDER — DIPHENHYDRAMINE HCL 50 MG/ML IJ SOLN: 50.0000 mg | Freq: Three times a day (TID) | INTRAMUSCULAR | Status: DC | PRN

## 2024-01-07 MED ORDER — HALOPERIDOL LACTATE 5 MG/ML IJ SOLN
5.0000 mg | Freq: Three times a day (TID) | INTRAMUSCULAR | Status: DC | PRN
Start: 2024-01-07 — End: 2024-01-16

## 2024-01-07 MED ORDER — HALOPERIDOL 5 MG PO TABS
5.0000 mg | ORAL_TABLET | Freq: Three times a day (TID) | ORAL | Status: DC | PRN
  Administered 2024-01-15: 5 mg via ORAL
  Filled 2024-01-07: qty 1

## 2024-01-07 MED ORDER — OLANZAPINE 5 MG PO TABS
5.0000 mg | ORAL_TABLET | Freq: Every day | ORAL | Status: DC
  Administered 2024-01-07: 5 mg via ORAL
  Filled 2024-01-07 (×3): qty 1

## 2024-01-07 MED ORDER — FLUOXETINE HCL 20 MG PO CAPS: 20.0000 mg | ORAL_CAPSULE | Freq: Every day | ORAL | Status: DC

## 2024-01-07 MED ORDER — FLUOXETINE HCL 20 MG PO CAPS
20.0000 mg | ORAL_CAPSULE | Freq: Every day | ORAL | Status: DC
  Administered 2024-01-08 – 2024-01-13 (×6): 20 mg via ORAL
  Filled 2024-01-07 (×8): qty 1

## 2024-01-07 MED ORDER — DIPHENHYDRAMINE HCL 25 MG PO CAPS
50.0000 mg | ORAL_CAPSULE | Freq: Three times a day (TID) | ORAL | Status: DC | PRN
  Administered 2024-01-15: 50 mg via ORAL
  Filled 2024-01-07: qty 2

## 2024-01-07 MED ORDER — MAGNESIUM HYDROXIDE 400 MG/5ML PO SUSP: 30.0000 mL | Freq: Every day | ORAL | Status: DC | PRN

## 2024-01-07 MED ORDER — HALOPERIDOL LACTATE 5 MG/ML IJ SOLN: 10.0000 mg | Freq: Three times a day (TID) | INTRAMUSCULAR | Status: DC | PRN

## 2024-01-07 MED ORDER — ALUM & MAG HYDROXIDE-SIMETH 200-200-20 MG/5ML PO SUSP: 30.0000 mL | ORAL | Status: DC | PRN

## 2024-01-07 MED ORDER — ACETAMINOPHEN 325 MG PO TABS
650.0000 mg | ORAL_TABLET | Freq: Four times a day (QID) | ORAL | Status: DC | PRN
  Administered 2024-01-11 – 2024-01-15 (×5): 650 mg via ORAL
  Filled 2024-01-07 (×5): qty 2

## 2024-01-07 MED ORDER — HYDROXYZINE HCL 25 MG PO TABS
25.0000 mg | ORAL_TABLET | Freq: Three times a day (TID) | ORAL | Status: DC | PRN
  Administered 2024-01-07 – 2024-01-14 (×6): 25 mg via ORAL
  Filled 2024-01-07 (×6): qty 1
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
## 2024-01-07 NOTE — Progress Notes (Signed)
   01/07/24 2300  Psych Admission Type (Psych Patients Only)  Admission Status Voluntary  Psychosocial Assessment  Patient Complaints Irritability;Anxiety;Anger;Worthlessness  Eye Contact Fair  Facial Expression Worried  Affect Anxious;Preoccupied  Surveyor, minerals Activity Other (Comment) (WDL)  Appearance/Hygiene Layered clothes  Behavior Characteristics Cooperative;Anxious  Mood Depressed;Anxious;Preoccupied  Thought Process  Coherency WDL  Content Blaming others;Blaming self  Delusions None reported or observed  Perception WDL  Hallucination None reported or observed  Judgment WDL  Confusion None  Danger to Self  Current suicidal ideation? Denies  Danger to Others  Danger to Others None reported or observed

## 2024-01-07 NOTE — Discharge Instructions (Signed)
Transfer to [facility] for higher level of care.

## 2024-01-07 NOTE — Progress Notes (Signed)
   01/07/24 1800  Psych Admission Type (Psych Patients Only)  Admission Status Voluntary  Psychosocial Assessment  Patient Complaints Irritability;Anxiety;Anger;Worrying;Worthlessness  Eye Contact Fair  Facial Expression Worried  Affect Anxious;Preoccupied  Speech Logical/coherent;Rapid  Information systems manager Activity Other (Comment) (WNL)  Appearance/Hygiene Layered clothes  Behavior Characteristics Cooperative;Anxious  Mood Depressed;Anxious;Preoccupied  Thought Process  Coherency WDL  Content Blaming others;Blaming self;Preoccupation;Paranoia  Delusions None reported or observed  Perception WDL  Hallucination None reported or observed  Judgment WDL  Confusion None  Danger to Self  Current suicidal ideation? Denies  Danger to Others  Danger to Others None reported or observed     01/07/24 1800  Psych Admission Type (Psych Patients Only)  Admission Status Voluntary  Psychosocial Assessment  Patient Complaints Irritability;Anxiety;Anger;Worrying;Worthlessness  Eye Contact Fair  Facial Expression Worried  Affect Anxious;Preoccupied  Speech Logical/coherent;Rapid  Information systems manager Activity Other (Comment) (WNL)  Appearance/Hygiene Layered clothes  Behavior Characteristics Cooperative;Anxious  Mood Depressed;Anxious;Preoccupied  Thought Process  Coherency WDL  Content Blaming others;Blaming self;Preoccupation;Paranoia  Delusions None reported or observed  Perception WDL  Hallucination None reported or observed  Judgment WDL  Confusion None  Danger to Self  Current suicidal ideation? Denies  Danger to Others  Danger to Others None reported or observed
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
## 2024-01-07 NOTE — BHH Group Notes (Signed)
B[facility] Group Notes:  (Nursing/MHT/Case Management/Adjunct)  Date:  01/07/2024  Time:  9:50 PM  Type of Therapy:  Psychoeducational Skills  Participation Level:  Did Not Attend  Participation Quality:  Resistant  Affect:  Resistant  Cognitive:  Lacking  Insight:  None  Engagement in Group:  None  Modes of Intervention:  Education  Summary of Progress/Problems: The patient did not attend group this evening.   Hazle Coca S 01/07/2024, 9:50 PM
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
## 2024-01-07 NOTE — ED Notes (Signed)
Report provided to Annice Pih, RN at [facility]. [facility] [employer] made aware of need for transportation.
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
## 2024-01-07 NOTE — Progress Notes (Signed)
Pt has been accepted to [facility] on 01/07/2024 Bed assignment: 405-1  Pt meets inpatient criteria per: Tommy Rainwater  Attending Physician will be: Phineas Inches, MD   Report can be called to:Adult unit: [phone]  Pt can arrive after pending labs, vol, EKG, UDS>   Care Team Notified: [facility] Rona Ravens RN, Tommy Rainwater MD   [place] Baili Stang LCSW-A   01/07/2024 2:50 PM
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
## 2024-01-07 NOTE — Tx Team (Signed)
Initial Treatment Plan 01/07/2024 6:10 PM Seth Mcintyre WUJ:[id number]    PATIENT STRESSORS: Marital or family conflict     PATIENT STRENGTHS: Printmaker for treatment/growth  Religious Affiliation    PATIENT IDENTIFIED PROBLEMS: "Mood swings, irritability and anger"                      DISCHARGE CRITERIA:  Improved stabilization in mood, thinking, and/or behavior Motivation to continue treatment in a less acute level of care  PRELIMINARY DISCHARGE PLAN: Outpatient therapy Participate in family therapy  PATIENT/FAMILY INVOLVEMENT: This treatment plan has been presented to and reviewed with the patient, Seth Mcintyre.  The patient has been given the opportunity to ask questions and make suggestions.  Karn Pickler, RN 01/07/2024, 6:10 PM

## 2024-01-07 NOTE — ED Notes (Signed)
Patient d/c in stable condition with all belongings to [facility] via [employer]. Patient denies SI, HI, AVH. Patient does not appear to be responding to internal stimuli.

## 2024-01-08 ENCOUNTER — Encounter (HOSPITAL_COMMUNITY): Payer: Self-pay

## 2024-01-08 DIAGNOSIS — F3163 Bipolar disorder, current episode mixed, severe, without psychotic features: Secondary | ICD-10-CM

## 2024-01-08 LAB — TSH: TSH: 3.472 u[IU]/mL (ref 0.350–4.500)

## 2024-01-08 LAB — LIPID PANEL
Cholesterol: 159 mg/dL (ref 0–200)
HDL: 52 mg/dL (ref >40)
LDL Cholesterol: 84 mg/dL (ref 0–99)
Total CHOL/HDL Ratio: 3.1 {ratio}
Triglycerides: 115 mg/dL (ref <150)
VLDL: 23 mg/dL (ref 0–40)

## 2024-01-08 LAB — FOLATE: Folate: 8.7 ng/mL (ref >5.9)

## 2024-01-08 LAB — VITAMIN B12: Vitamin B-12: 553 pg/mL (ref 180–914)

## 2024-01-08 MED ORDER — AMLODIPINE BESYLATE 5 MG PO TABS
5.0000 mg | ORAL_TABLET | Freq: Every day | ORAL | Status: DC
  Administered 2024-01-08 – 2024-01-11 (×4): 5 mg via ORAL
  Filled 2024-01-08 (×6): qty 1

## 2024-01-08 MED ORDER — OLANZAPINE 10 MG PO TABS
10.0000 mg | ORAL_TABLET | Freq: Every day | ORAL | Status: DC
  Administered 2024-01-08 – 2024-01-10 (×3): 10 mg via ORAL
  Filled 2024-01-08 (×5): qty 1

## 2024-01-08 NOTE — [facility] Suicide Risk Assessment (Signed)
Suicide Risk Assessment  Admission Assessment    [facility] Admission Suicide Risk Assessment   Nursing information obtained from:    Demographic factors:    Current Mental Status:    Loss Factors:    Historical Factors:    Risk Reduction Factors:     Total Time spent with patient: 1 hour Principal Problem: Bipolar 1 disorder, mixed, severe (HCC) Diagnosis:  Principal Problem:   Bipolar 1 disorder, mixed, severe (HCC)   Subjective Data:   Seth Mcintyre is a 58 y.o. male  with a past psychiatric history of historical diagnosis of bipolar disorder and PTSD. Patient initially arrived to [facility] on 1/20 for intrusive homicidal thoughts, worsening depressed mood, and passive suicidal ideation in the setting of housing instability, and admitted to [facility] voluntarily on 1/20 for acute safety concerns, crisis stabalization, impaired functioning, homicidal behaviors, severe substance-induced psychosis or mood disturbances, and intensive therapeutic interventions. PMHx is non-contributory.   HPI: Patient presented to [facility] accompanied by pastor on 1/21 seeking inpatient psychiatric care.  Patient endorsed multiple issues at the time including: Low mood, passive suicidal ideation, noncommand auditory hallucinations, mood instability, PTSD symptomatology and difficulty controlling anger.  Patient was amenable to [facility] transfer.  He did not require as needed medications during his stay.  At that time he was started on olanzapine 5 mg nightly for bipolar depression, fluoxetine 20 mg nightly for bipolar depression and present 2 mg nightly for PTSD, which he received evening of 1/20.   Continued Clinical Symptoms:    The "Alcohol Use Disorders Identification Test", Guidelines for Use in Primary Care, Second Edition.  World Science writer [facility]). Score between 0-7:  no or low risk or alcohol related problems. Score between 8-15:  moderate risk of alcohol related problems. Score between 16-19:  high risk of  alcohol related problems. Score 20 or above:  warrants further diagnostic evaluation for alcohol dependence and treatment.   CLINICAL FACTORS:   Severe Anxiety and/or Agitation Panic Attacks Depression:   Aggression Hopelessness Impulsivity Insomnia Personality Disorders:   Cluster B Comorbid depression More than one psychiatric diagnosis Unstable or Poor Therapeutic Relationship Previous Psychiatric Diagnoses and Treatments   Psychiatric Specialty Exam:   Presentation    General Appearance:  Casual; Fairly Groomed   Eye Contact:  Fair   Speech:  Clear and Coherent   Speech Volume:  Normal   Handedness:  Right     Mood and Affect    Mood:  Depressed; Anxious; Hopeless   Affect:  Congruent; Depressed     Thinking     Thought Processes:  Coherent; Linear   Descriptions of Associations:  Intact   Orientation:  Full (Time, Place and Person)   Thought Content:  Paranoid Ideation   History of Schizophrenia/Schizoaffective disorder:  No     Duration of Psychotic Symptoms:  constant, but worsened over the past year with loss of housing   Hallucinations:  Auditory Hears voices, but says that he can make them go away. Dark spots/figures, but only when stressed   Ideas of Reference:  None   Suicidal Thoughts:  No Without Intent; Without Plan; With Means to Carry Out; With Access to Means   Homicidal Thoughts:  Yes, Active Without Intent; Without Plan     Sensorium      Memory:  Immediate Fair   Judgment:  Good   Insight:  Fair     Executive Functions      Concentration:  Good   Attention Span:  Good  Recall:  Good   Fund of Knowledge:  Good   Language:  Good     Musculoskeletal:   Strength & muscle tone: within normal limits Gait & station: normal Patient leans: N/A   Psychomotor Activity:  Restlessness       Assets:  Communication Skills; Desire for Improvement; Social Support       Sleep:   Poor       Physical Exam Constitutional:      General: He is not in acute distress.    Appearance: He is not ill-appearing.  Pulmonary:     Effort: Pulmonary effort is normal. No respiratory distress.  Neurological:     Mental Status: He is alert.      Review of Systems  All other systems reviewed and are negative.   Blood pressure (!) 153/91, pulse 60, temperature 98.7 F (37.1 C), temperature source Oral, resp. rate 18, height 5\' 9"  (1.753 m), weight 95.7 kg, SpO2 97%. Body mass index is 31.16 kg/m.   COGNITIVE FEATURES THAT CONTRIBUTE TO RISK:  Closed-mindedness and Loss of executive function    SUICIDE RISK:  Moderate:  persistent suicidal ideation with limited intensity, and duration, some specificity in terms of plans, no associated intent, reasonable self-control, limited dysphoria/symptomatology, some risk factors present, and identifiable protective factors, including available and accessible social support.  PLAN OF CARE: See H&P for assessment and plan.   I certify that inpatient services furnished can reasonably be expected to improve the patient's condition.   Tomie China, MD 01/08/2024, 4:03 PM

## 2024-01-08 NOTE — Plan of Care (Signed)
  Problem: Coping: Goal: Ability to verbalize frustrations and anger appropriately will improve Outcome: Progressing  Patient denies SI, HI, and AVH at this time. Patient endorses anger and irritability toward family members, but states he has no desire to harm them. Patient reported being off of his medications which patient reports has increased his irritability and agitation. Patient has been compliant with medications, attending groups, and engaged appropriately with staff and peers.   Engage patient in 1:1 staff talks, offer medications as prescribed, assess patient for safety.   Continue to monitor as planned. Patient able to contract for safety.
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
## 2024-01-08 NOTE — Group Note (Signed)
Recreation Therapy Group Note   Group Topic:Animal Assisted Therapy   Group Date: 01/08/2024 Start Time: 1610 End Time: 1030 Facilitators: Lempi Edwin-McCall, LRT,CTRS Location: [address] Dayroom   Animal-Assisted Activity (AAA) Program Checklist/Progress Notes Patient Eligibility Criteria Checklist & Daily Group note for Rec Tx Intervention  AAA/T Program Assumption of Risk Form signed by Patient/ or Parent Legal Guardian Yes  Patient is free of allergies or severe asthma Yes  Patient reports no fear of animals Yes  Patient reports no history of cruelty to animals Yes  Patient understands his/her participation is voluntary Yes  Patient washes hands before animal contact Yes  Patient washes hands after animal contact Yes  Education: Hand Washing, Appropriate Animal Interaction   Education Outcome: Acknowledges education.    Affect/Mood: Flat   Participation Level: Engaged   Participation Quality: Independent   Behavior: Appropriate   Speech/Thought Process: Focused   Insight: Good   Judgement: Good   Modes of Intervention: Teaching laboratory technician   Patient Response to Interventions:  Engaged   Education Outcome:  In group clarification offered    Clinical Observations/Individualized Feedback: Pt had some engagement with therapy dog team at the beginning of group before being called out to meet with social worker. Pt returned as group was wrapping up.     Plan: Continue to engage patient in RT group sessions 2-3x/week.   Seth Mcintyre, LRT,CTRS 01/08/2024 1:23 PM

## 2024-01-08 NOTE — Group Note (Signed)
LCSW Group Therapy Note   Group Date: 01/08/2024 Start Time: 1100 End Time: 1200   Type of Therapy:  Group Therapy  Topic:  Shining from Within:  Confidence and Self-Love Journey  Participation:  patient was present and actively participated in the discussion  Objective: Promote Self-Awareness and Realistic Self-Talk: Help participants recognize their strengths and replace negative thoughts with truthful, realistic statements to build confidence.  Goals: Increase Confidence: Help participants develop a positive self-image by focusing on their strengths and personal progress. Set Achievable Goals: Guide participants in creating small, realistic goals that foster a sense of accomplishment and build momentum. Enhance Self-Care Practices: Encourage participants to incorporate self-care activities into their routine to support emotional well-being and reinforce confidence.  Summary: The "Shining from Within: Confidence and Self-Love Journey" group helped individuals build stronger self-confidence through truthful, realistic self-talk, goal-setting, and self-care practices. Participants recognized their unique strengths, set achievable goals, and nurtured a supportive environment for mutual growth. The group provided practical tools to foster lasting confidence and self-belief, empowering each member to shine from within and embrace their personal power with greater self-assurance.  Therapeutic Modalities: Elements of CBT Elements of DBT  Alla Feeling, LCSWA 01/08/2024  6:33 PM

## 2024-01-08 NOTE — [facility] Group Notes (Signed)
[facility] Group Notes:  (Nursing/MHT/Case Management/Adjunct)  Date:  01/08/2024  Time:  10:47 PM  Type of Therapy:  Psychoeducational Skills  Participation Level:  Active  Participation Quality:  Appropriate  Affect:  Anxious  Cognitive:  Appropriate  Insight:  Good and Improving  Engagement in Group:  Engaged and Improving  Modes of Intervention:  Education  Summary of Progress/Problems: The patient rated his day as a 3 out of 10 since he recognized the staff that works here. Despite that, he acknowledges that he has problems and that he needs to stop focusing on what other people think of him. His goal for tomorrow is to "keep focused".   Westly Pam 01/08/2024, 10:47 PM

## 2024-01-08 NOTE — H&P (Addendum)
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 reviewed the patient's chart, and I participated in key portions of the service. I discussed the case with the [employer], and I agree with the assessment and plan of care as documented in the House Officer's note, unless addended by me or notated below.  Criss Alvine, MD Psychiatrist

## 2024-01-08 NOTE — Plan of Care (Signed)
  Problem: Education: Goal: Knowledge of [facility] Education information/materials will improve Outcome: Progressing Goal: Emotional status will improve Outcome: Progressing Goal: Mental status will improve Outcome: Progressing Goal: Verbalization of understanding the information provided will improve Outcome: Progressing   

## 2024-01-09 ENCOUNTER — Encounter (HOSPITAL_COMMUNITY): Payer: Self-pay

## 2024-01-09 LAB — VITAMIN D 25 HYDROXY (VIT D DEFICIENCY, FRACTURES): Vit D, 25-Hydroxy: 11.07 ng/mL — ABNORMAL LOW (ref 30–100)

## 2024-01-09 LAB — RPR: RPR Ser Ql: NONREACTIVE

## 2024-01-09 MED ORDER — VITAMIN D (ERGOCALCIFEROL) 1.25 MG (50000 UNIT) PO CAPS
50000.0000 [IU] | ORAL_CAPSULE | ORAL | Status: DC
  Administered 2024-01-09 – 2024-01-16 (×2): 50000 [IU] via ORAL
  Filled 2024-01-09 (×3): qty 1

## 2024-01-09 MED ORDER — VITAMIN D3 25 MCG PO TABS
2000.0000 [IU] | ORAL_TABLET | Freq: Every day | ORAL | Status: DC
  Administered 2024-01-10 – 2024-01-16 (×7): 2000 [IU] via ORAL
  Filled 2024-01-09 (×9): qty 2

## 2024-01-09 NOTE — Group Note (Signed)
Date:  01/09/2024 Time:  10:51 AM  Group Topic/Focus:  Goals Group:   The focus of this group is to help patients establish daily goals to achieve during treatment and discuss how the patient can incorporate goal setting into their daily lives to aide in recovery. Orientation:   The focus of this group is to educate the patient on the purpose and policies of crisis stabilization and provide a format to answer questions about their admission.  The group details unit policies and expectations of patients while admitted.    Participation Level:  Did Not Attend  Participation Quality:   n/a  Affect:   n/a  Cognitive:   n/a  Insight: None  Engagement in Group:   n/a  Modes of Intervention:   n/a  Additional Comments:   Pt did not attend.  Seth Mcintyre 01/09/2024, 10:51 AM

## 2024-01-09 NOTE — Group Note (Signed)
Date:  01/09/2024 Time:  4:52 PM  Group Topic/Focus:  Dimensions of Wellness:   The focus of this group is to introduce the topic of wellness and discuss the role each dimension of wellness plays in total health.    Participation Level:  Active  Participation Quality:  Appropriate  Affect:  Appropriate  Cognitive:  Appropriate  Insight: Appropriate  Engagement in Group:  Engaged  Modes of Intervention:  Discussion and Education  Additional Comments:   Pt attended and participated in the Physical Wellness education group. Pt was calm, cooperative and attentive throughout the group.  Edmund Hilda Marin Wisner 01/09/2024, 4:52 PM

## 2024-01-09 NOTE — Progress Notes (Signed)
Patient rated his anxiety level 10/10 and his depression level 7/10 with 10 being the highest and 0 none. Medication and group compliant. Patient observed interacting well with some peers. Appetite good on shift. Safety maintained.  01/09/24 0900  Psych Admission Type (Psych Patients Only)  Admission Status Voluntary  Psychosocial Assessment  Patient Complaints Anxiety;Depression  Eye Contact Fair  Facial Expression Anxious;Worried  Affect Anxious;Depressed  Surveyor, minerals Activity Other (Comment) (wnl)  Appearance/Hygiene Unremarkable  Behavior Characteristics Cooperative;Anxious  Mood Depressed;Anxious  Thought Process  Coherency WDL  Content Blaming others;Blaming self  Delusions None reported or observed  Perception WDL  Hallucination None reported or observed  Judgment WDL  Confusion None  Danger to Self  Current suicidal ideation? Denies  Danger to Others  Danger to Others None reported or observed

## 2024-01-09 NOTE — Group Note (Signed)
Recreation Therapy Group Note   Group Topic:Other  Group Date: 01/09/2024 Start Time: 1405 End Time: 1445 Facilitators: Keron Koffman-McCall, LRT,CTRS Location: [address] Dayroom   Activity Description/Intervention: Therapeutic Drumming. Patients with peers and staff were given the opportunity to engage in a leader facilitated [facility] Group Empowerment Drumming Circle with staff from the [employer], in partnership with [employer]. Teaching laboratory technician and trained [employer], Theodoro Doing leading with LRT observing and documenting intervention and pt response. This evidenced-based practice targets 7 areas of health and wellbeing in the human experience including: stress-reduction, exercise, self-expression, camaraderie/support, nurturing, spirituality, and music-making (leisure).   Goal Area(s) Addresses:  Patient will engage in pro-social way in music group.  Patient will follow directions of drum leader on the first prompt. Patient will demonstrate no behavioral issues during group.  Patient will identify if a reduction in stress level occurs as a result of participation in therapeutic drum circle.    Education: Leisure exposure, Pharmacologist, Musical expression, Discharge Planning   Affect/Mood: N/A   Participation Level: Did not attend    Clinical Observations/Individualized Feedback:    Plan: Continue to engage patient in RT group sessions 2-3x/week.   Tarvaris Puglia-McCall, LRT,CTRS 01/09/2024 3:45 PM

## 2024-01-09 NOTE — Progress Notes (Signed)
   01/09/24 0000  Psych Admission Type (Psych Patients Only)  Admission Status Voluntary  Psychosocial Assessment  Patient Complaints Anxiety;Depression  Eye Contact Fair  Facial Expression Worried  Affect Anxious;Preoccupied  Speech Logical/coherent  Interaction Assertive  Motor Activity Other (Comment) (WDL)  Appearance/Hygiene Layered clothes  Behavior Characteristics Cooperative  Mood Depressed;Anxious;Preoccupied  Thought Process  Coherency WDL  Content Blaming others;Blaming self  Delusions None reported or observed  Perception WDL  Hallucination None reported or observed  Judgment WDL  Confusion None  Danger to Self  Current suicidal ideation? Denies  Danger to Others  Danger to Others None reported or observed

## 2024-01-09 NOTE — Plan of Care (Signed)
  Problem: Education: Goal: Knowledge of [facility] Education information/materials will improve Outcome: Progressing Goal: Emotional status will improve Outcome: Progressing Goal: Mental status will improve Outcome: Progressing Goal: Verbalization of understanding the information provided will improve Outcome: Progressing   

## 2024-01-09 NOTE — BH IP Treatment Plan (Signed)
Interdisciplinary Treatment and Diagnostic Plan Update  01/09/2024 Time of Session: 11:05AM Seth Mcintyre MRN: [id number]  Principal Diagnosis: Bipolar 1 disorder, mixed, severe (HCC)  Secondary Diagnoses: Principal Problem:   Bipolar 1 disorder, mixed, severe (HCC)   Current Medications:  Current Facility-Administered Medications  Medication Dose Route Frequency Provider Last Rate Last Admin   acetaminophen (TYLENOL) tablet 650 mg  650 mg Oral Q6H PRN Margaretmary Dys, MD       alum & mag hydroxide-simeth (MAALOX/MYLANTA) 200-200-20 MG/5ML suspension 30 mL  30 mL Oral Q4H PRN Margaretmary Dys, MD       amLODipine (NORVASC) tablet 5 mg  5 mg Oral Daily Tomie China, MD   5 mg at 01/09/24 4098   haloperidol (HALDOL) tablet 5 mg  5 mg Oral TID PRN Margaretmary Dys, MD       And   diphenhydrAMINE (BENADRYL) capsule 50 mg  50 mg Oral TID PRN Margaretmary Dys, MD       haloperidol lactate (HALDOL) injection 5 mg  5 mg Intramuscular TID PRN Margaretmary Dys, MD       And   diphenhydrAMINE (BENADRYL) injection 50 mg  50 mg Intramuscular TID PRN Margaretmary Dys, MD       And   LORazepam (ATIVAN) injection 2 mg  2 mg Intramuscular TID PRN Margaretmary Dys, MD       haloperidol lactate (HALDOL) injection 10 mg  10 mg Intramuscular TID PRN Margaretmary Dys, MD       And   diphenhydrAMINE (BENADRYL) injection 50 mg  50 mg Intramuscular TID PRN Margaretmary Dys, MD       And   LORazepam (ATIVAN) injection 2 mg  2 mg Intramuscular TID PRN Margaretmary Dys, MD       FLUoxetine (PROZAC) capsule 20 mg  20 mg Oral Daily Margaretmary Dys, MD   20 mg at 01/09/24 1191   hydrOXYzine (ATARAX) tablet 25 mg  25 mg Oral TID PRN Margaretmary Dys, MD   25 mg at 01/07/24 2043   magnesium hydroxide (MILK OF MAGNESIA)  suspension 30 mL  30 mL Oral Daily PRN Margaretmary Dys, MD       OLANZapine [facility]) tablet 10 mg  10 mg Oral Devoria Glassing, MD   10 mg at 01/08/24 2139   prazosin (MINIPRESS) capsule 2 mg  2 mg Oral QHS Margaretmary Dys, MD   2 mg at 01/08/24 2139   Vitamin D (Ergocalciferol) (DRISDOL) 1.25 MG (50000 UNIT) capsule 50,000 Units  50,000 Units Oral Q7 days Golda Acre, MD   50,000 Units at 01/09/24 1024   [START ON 01/10/2024] vitamin D3 (CHOLECALCIFEROL) tablet 2,000 Units  2,000 Units Oral Daily Golda Acre, MD       PTA Medications: Medications Prior to Admission  Medication Sig Dispense Refill Last Dose/Taking   FLUoxetine (PROZAC) 20 MG capsule Take 1 capsule (20 mg total) by mouth daily.       Patient Stressors: Marital or family conflict    Patient Strengths: Printmaker for treatment/growth  Religious Affiliation   Treatment Modalities: Medication Management, Group therapy, Case management,  1 to 1 session with clinician, Psychoeducation, Recreational therapy.   Physician Treatment Plan for Primary Diagnosis: Bipolar 1 disorder, mixed, severe (HCC) Long Term Goal(s):     Short Term Goals: Ability to identify  changes in lifestyle to reduce recurrence of condition will improve Ability to verbalize feelings will improve Ability to disclose and discuss suicidal ideas Ability to demonstrate self-control will improve Ability to identify and develop effective coping behaviors will improve Ability to maintain clinical measurements within normal limits will improve Compliance with prescribed medications will improve Ability to identify triggers associated with substance abuse/mental health issues will improve  Medication Management: Evaluate patient's response, side effects, and tolerance of medication regimen.  Therapeutic Interventions: 1 to 1 sessions, Unit Group sessions and Medication  administration.  Evaluation of Outcomes: Not Progressing  Physician Treatment Plan for Secondary Diagnosis: Principal Problem:   Bipolar 1 disorder, mixed, severe (HCC)  Long Term Goal(s):     Short Term Goals: Ability to identify changes in lifestyle to reduce recurrence of condition will improve Ability to verbalize feelings will improve Ability to disclose and discuss suicidal ideas Ability to demonstrate self-control will improve Ability to identify and develop effective coping behaviors will improve Ability to maintain clinical measurements within normal limits will improve Compliance with prescribed medications will improve Ability to identify triggers associated with substance abuse/mental health issues will improve     Medication Management: Evaluate patient's response, side effects, and tolerance of medication regimen.  Therapeutic Interventions: 1 to 1 sessions, Unit Group sessions and Medication administration.  Evaluation of Outcomes: Not Progressing   RN Treatment Plan for Primary Diagnosis: Bipolar 1 disorder, mixed, severe (HCC) Long Term Goal(s): Knowledge of disease and therapeutic regimen to maintain health will improve  Short Term Goals: Ability to remain free from injury will improve, Ability to verbalize frustration and anger appropriately will improve, Ability to demonstrate self-control, Ability to participate in decision making will improve, Ability to verbalize feelings will improve, Ability to disclose and discuss suicidal ideas, Ability to identify and develop effective coping behaviors will improve, and Compliance with prescribed medications will improve  Medication Management: RN will administer medications as ordered by provider, will assess and evaluate patient's response and provide education to patient for prescribed medication. RN will report any adverse and/or side effects to prescribing provider.  Therapeutic Interventions: 1 on 1 counseling  sessions, Psychoeducation, Medication administration, Evaluate responses to treatment, Monitor vital signs and CBGs as ordered, Perform/monitor CIWA, COWS, AIMS and Fall Risk screenings as ordered, Perform wound care treatments as ordered.  Evaluation of Outcomes: Not Progressing   LCSW Treatment Plan for Primary Diagnosis: Bipolar 1 disorder, mixed, severe (HCC) Long Term Goal(s): Safe transition to appropriate next level of care at discharge, Engage patient in therapeutic group addressing interpersonal concerns.  Short Term Goals: Engage patient in aftercare planning with referrals and resources, Increase social support, Increase ability to appropriately verbalize feelings, Increase emotional regulation, Facilitate acceptance of mental health diagnosis and concerns, Facilitate patient progression through stages of change regarding substance use diagnoses and concerns, Identify triggers associated with mental health/substance abuse issues, and Increase skills for wellness and recovery  Therapeutic Interventions: Assess for all discharge needs, 1 to 1 time with Social worker, Explore available resources and support systems, Assess for adequacy in community support network, Educate family and significant other(s) on suicide prevention, Complete Psychosocial Assessment, Interpersonal group therapy.  Evaluation of Outcomes: Not Progressing   Progress in Treatment: Attending groups: Yes. Participating in groups: Yes. Taking medication as prescribed: Yes. Toleration medication: Yes. Family/Significant other contact made: No, will contact:  Wife, Vanessa Ralphs [phone] OR Gigi Gin [phone] Patient understands diagnosis: Yes. Discussing patient identified problems/goals with staff: Yes. Medical problems stabilized  or resolved: Yes. Denies suicidal/homicidal ideation: Yes. Issues/concerns per patient self-inventory: No.  New problem(s) identified: No, Describe:  none  reported  New Short Term/Long Term Goal(s): medication stabilization, elimination of SI thoughts, development of comprehensive mental wellness plan.    Patient Goals:  "Get well and leave"  Discharge Plan or Barriers: Patient recently admitted. CSW will continue to follow and assess for appropriate referrals and possible discharge planning.    Reason for Continuation of Hospitalization: Depression Homicidal ideation Medication stabilization  Estimated Length of Stay: 5-7 days  Last 3 [place] Suicide Severity Risk Score: Flowsheet Row [facility] (Current[facility] Most recent reading at 01/07/2024  5:00 PM ED from 01/07/2024 in [facility] Most recent reading at 01/07/2024  2:09 PM Counselor from 05/10/2023 in [facility] Most recent reading at 05/10/2023  8:17 AM  C-SSRS RISK CATEGORY No Risk No Risk No Risk       Last PHQ 2/9 Scores:    05/10/2023    8:15 AM 04/26/2022    1:43 PM 02/22/2022   11:46 AM  Depression screen PHQ 2/9  Decreased Interest 3 3 3   Down, Depressed, Hopeless 3 3 3   PHQ - 2 Score 6 6 6   Altered sleeping 3 3 2   Tired, decreased energy 3 3 3   Change in appetite 2 3 2   Feeling bad or failure about yourself  3 3 3   Trouble concentrating 2 3 3   Moving slowly or fidgety/restless 2 3 3   Suicidal thoughts 0 3 3  PHQ-9 Score 21 27 25   Difficult doing work/chores Very difficult Extremely dIfficult Somewhat difficult    Scribe for Treatment Team: Esmeralda Arthur 01/09/2024 12:47 PM

## 2024-01-09 NOTE — Plan of Care (Signed)
  Problem: Education: Goal: Emotional status will improve Outcome: Progressing   Problem: Activity: Goal: Interest or engagement in activities will improve Outcome: Progressing Goal: Sleeping patterns will improve Outcome: Progressing   Problem: Coping: Goal: Ability to verbalize frustrations and anger appropriately will improve Outcome: Progressing Goal: Ability to demonstrate self-control will improve Outcome: Progressing   Problem: Safety: Goal: Periods of time without injury will increase Outcome: Progressing

## 2024-01-10 DIAGNOSIS — F3163 Bipolar disorder, current episode mixed, severe, without psychotic features: Secondary | ICD-10-CM | POA: Diagnosis not present

## 2024-01-10 NOTE — Progress Notes (Signed)
   01/09/24 2035  Psych Admission Type (Psych Patients Only)  Admission Status Voluntary  Psychosocial Assessment  Patient Complaints Anxiety;Depression  Eye Contact Brief  Facial Expression Anxious  Affect Anxious;Depressed;Preoccupied  Speech Logical/coherent  Interaction Assertive  Motor Activity Other (Comment) (WDL)  Appearance/Hygiene Unremarkable  Behavior Characteristics Appropriate to situation  Mood Depressed;Anxious  Thought Process  Coherency WDL  Content Blaming others  Delusions None reported or observed  Perception Hallucinations  Hallucination Auditory;Visual  Judgment WDL  Confusion None  Danger to Self  Current suicidal ideation? Denies  Danger to Others  Danger to Others None reported or observed

## 2024-01-10 NOTE — Plan of Care (Signed)
  Problem: Education: Goal: Emotional status will improve Outcome: Not Progressing Goal: Mental status will improve Outcome: Not Progressing   

## 2024-01-10 NOTE — Group Note (Signed)
Date:  01/10/2024 Time:  9:58 PM  Group Topic/Focus:  Wrap-Up Group:   The focus of this group is to help patients review their daily goal of treatment and discuss progress on daily workbooks.    Participation Level:  Active  Participation Quality:  Appropriate and Attentive  Affect:  Appropriate  Cognitive:  Appropriate  Insight: Appropriate and Good  Engagement in Group:  Engaged  Modes of Intervention:  Discussion  Additional Comments:  Patient limited his conversation in group but did share a lot with a few peers and myself.  Patient stated his day was a 6 out of 10.    Alfonse Ras 01/10/2024, 9:58 PM

## 2024-01-10 NOTE — Progress Notes (Signed)
   01/10/24 1000  Psych Admission Type (Psych Patients Only)  Admission Status Voluntary  Psychosocial Assessment  Patient Complaints Depression  Eye Contact Fair  Facial Expression Flat  Affect Depressed  Speech Logical/coherent  Interaction Minimal  Motor Activity Slow  Appearance/Hygiene Unremarkable  Behavior Characteristics Appropriate to situation  Mood Depressed  Thought Process  Coherency WDL  Content Blaming others  Delusions None reported or observed  Perception Hallucinations  Hallucination Auditory  Judgment Poor  Confusion None  Danger to Self  Current suicidal ideation? Denies  Danger to Others  Danger to Others None reported or observed   Dar Note: Patient presents with a flat affect and depressed mood.  Denies suicidal thoughts, auditory and visual hallucinations.  Medications given as prescribed.  Routine safety checks maintained.  Patient visible in milieu with minimal interactions with staff and peers.  Patient is safe on the unit.

## 2024-01-10 NOTE — Group Note (Signed)
[facility] LCSW Group Therapy Note   Group Date: 01/10/2024 Start Time: 1100 End Time: 1200   Type of Therapy/Topic:  Group Therapy:  Emotion Regulation  Participation Level:  Did Not Attend   Mood:  Description of Group:    The purpose of this group is to assist patients in learning to regulate negative emotions and experience positive emotions. Patients will be guided to discuss ways in which they have been vulnerable to their negative emotions. These vulnerabilities will be juxtaposed with experiences of positive emotions or situations, and patients challenged to use positive emotions to combat negative ones. Special emphasis will be placed on coping with negative emotions in conflict situations, and patients will process healthy conflict resolution skills.  Therapeutic Goals: Patient will identify two positive emotions or experiences to reflect on in order to balance out negative emotions:  Patient will label two or more emotions that they find the most difficult to experience:  Patient will be able to demonstrate positive conflict resolution skills through discussion or role plays:   Summary of Patient Progress:   Pt invited did not attend    Therapeutic Modalities:   Cognitive Behavioral Therapy Feelings Identification Dialectical Behavioral Therapy   Steffanie Dunn, LCSW
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
## 2024-01-10 NOTE — Progress Notes (Signed)
[facility] MD Progress Note  01/11/2024 9:10 AM Seth Mcintyre  MRN:  [id number]  Subjective:  58 y.o. male  with a past psychiatric history of historical diagnosis of bipolar disorder and PTSD. Patient initially arrived to [facility] on 1/20 for intrusive homicidal thoughts, worsening depressed mood, and passive suicidal ideation in the setting of housing instability, and admitted to [facility] voluntarily on 1/20 for acute safety concerns, crisis stabalization, impaired functioning, homicidal behaviors, severe substance-induced psychosis or mood disturbances, and intensive therapeutic interventions.   Daily notes: Zev is seen. Chart reviewed. The chart findings discussed with the treatment. He presents alert, oriented & aware of situation. He present with a good affect, good eye contact & verbally responsive. He is visible on the unit, attending group sessions. He reports, "I have been in this hospital since Monday. What happened was, I was out of my medicines for a long time. Then I started getting angry because I can't be around people. People just irritate me easily. Like while we were at lunch in the cafeteria a while ago, one of the staff tried to tell me that he will bring me my tray. I was like, I will get my own tray. Immediately, this anger came over & felt like...Marland KitchenMarland KitchenMarland Kitchen I was doing well this morning up until that woman had an outburst. That irritated me so much that it brought my depression back. Then, the thing with the male staff trying to serve me my tray kind of made mad. I'm not feeling homicidal right at this minute until someone messes with me. I slept well last night, however, I wake up each time my roommate moves or gets up to go to the bathroom or when the staff opens the door". [name] currently denies any SI, AVH, delusional thoughts or paranoia. He does not appear to be responding to any internal stimuli. Reviewed current lab results, stable. Reviewed vital signs, blood pressure remains elevated.  Will increase amlodipine to 10 mg daily. Discussed this case with the attending psychiatrist. See the treatment plan below.  Principal Problem: Bipolar 1 disorder, mixed, severe (HCC)  Diagnosis: Principal Problem:   Bipolar 1 disorder, mixed, severe (HCC)  Total Time spent with patient: 45 minutes  Past Psychiatric History: See H&P  Past Medical History:  Past Medical History:  Diagnosis Date   Bipolar disorder (HCC)    H/O psychiatric hospitalization    Homicidal ideation    PTSD (post-traumatic stress disorder)    Suicidal ideation     Past Surgical History:  Procedure Laterality Date   APPENDECTOMY     Family History:  Family History  Family history unknown: Yes   Family Psychiatric  History: See H&P  Social History:  Social History   Substance and Sexual Activity  Alcohol Use Yes   Alcohol/week: 8.0 standard drinks of alcohol   Types: 8 Cans of beer per week     Social History   Substance and Sexual Activity  Drug Use Yes   Types: Marijuana    Social History   Socioeconomic History   Marital status: Married    Spouse name: Metallurgist   Number of children: Not on file   Years of education: Not on file   Highest education level: Not on file  Occupational History   Not on file  Tobacco Use   Smoking status: Never   Smokeless tobacco: Never  Vaping Use   Vaping status: Never Used  Substance and Sexual Activity   Alcohol use: Yes  Alcohol/week: 8.0 standard drinks of alcohol    Types: 8 Cans of beer per week   Drug use: Yes    Types: Marijuana   Sexual activity: Yes  Other Topics Concern   Not on file  Social History Narrative   Not on file   Social Drivers of Health   Financial Resource Strain: Low Risk  (09/30/2019)   Overall Financial Resource Strain (CARDIA)    Difficulty of Paying Living Expenses: Not hard at all  Food Insecurity: Food Insecurity Present (01/07/2024)   Hunger Vital Sign    Worried About Running Out of Food in  the Last Year: Often true    Ran Out of Food in the Last Year: Often true  Transportation Needs: No Transportation Needs (01/07/2024)   PRAPARE - Administrator, Civil Service (Medical): No    Lack of Transportation (Non-Medical): No  Physical Activity: Insufficiently Active (09/30/2019)   Exercise Vital Sign    Days of Exercise per Week: 2 days    Minutes of Exercise per Session: 30 min  Stress: No Stress Concern Present (09/30/2019)   [employer] of Occupational Health - Occupational Stress Questionnaire    Feeling of Stress : Not at all  Social Connections: Moderately Isolated (05/10/2023)   Social Connection and Isolation Panel [NHANES]    Frequency of Communication with Friends and Family: Once a week    Frequency of Social Gatherings with Friends and Family: Never    Attends Religious Services: More than 4 times per year    Active Member of [employer] or Organizations: No    Attends Engineer, structural: Never    Marital Status: Married   Additional Social History:   Sleep: Good  Appetite:  Good  Current Medications: Current Facility-Administered Medications  Medication Dose Route Frequency Provider Last Rate Last Admin   acetaminophen (TYLENOL) tablet 650 mg  650 mg Oral Q6H PRN Margaretmary Dys, MD       alum & mag hydroxide-simeth (MAALOX/MYLANTA) 200-200-20 MG/5ML suspension 30 mL  30 mL Oral Q4H PRN Margaretmary Dys, MD       amLODipine (NORVASC) tablet 5 mg  5 mg Oral Daily Tomie China, MD   5 mg at 01/11/24 6213   haloperidol (HALDOL) tablet 5 mg  5 mg Oral TID PRN Margaretmary Dys, MD       And   diphenhydrAMINE (BENADRYL) capsule 50 mg  50 mg Oral TID PRN Margaretmary Dys, MD       haloperidol lactate (HALDOL) injection 5 mg  5 mg Intramuscular TID PRN Margaretmary Dys, MD       And   diphenhydrAMINE (BENADRYL) injection 50 mg  50 mg Intramuscular TID PRN Margaretmary Dys, MD       And   LORazepam (ATIVAN) injection 2 mg  2 mg Intramuscular TID PRN Margaretmary Dys, MD       haloperidol lactate (HALDOL) injection 10 mg  10 mg Intramuscular TID PRN Margaretmary Dys, MD       And   diphenhydrAMINE (BENADRYL) injection 50 mg  50 mg Intramuscular TID PRN Margaretmary Dys, MD       And   LORazepam (ATIVAN) injection 2 mg  2 mg Intramuscular TID PRN Margaretmary Dys, MD       FLUoxetine (PROZAC) capsule 20 mg  20 mg Oral Daily Margaretmary Dys, MD  20 mg at 01/11/24 1610   hydrOXYzine (ATARAX) tablet 25 mg  25 mg Oral TID PRN Margaretmary Dys, MD   25 mg at 01/09/24 2120   magnesium hydroxide (MILK OF MAGNESIA) suspension 30 mL  30 mL Oral Daily PRN Margaretmary Dys, MD       OLANZapine [facility]) tablet 10 mg  10 mg Oral Devoria Glassing, MD   10 mg at 01/10/24 2110   prazosin (MINIPRESS) capsule 2 mg  2 mg Oral QHS Margaretmary Dys, MD   2 mg at 01/10/24 2110   Vitamin D (Ergocalciferol) (DRISDOL) 1.25 MG (50000 UNIT) capsule 50,000 Units  50,000 Units Oral Q7 days Golda Acre, MD   50,000 Units at 01/09/24 1024   vitamin D3 (CHOLECALCIFEROL) tablet 2,000 Units  2,000 Units Oral Daily Golda Acre, MD   2,000 Units at 01/11/24 0732   Lab Results:  No results found for this or any previous visit (from the past 48 hours).  Blood Alcohol level:  Lab Results  Component Value Date   ETH <10 01/07/2024   ETH 108 (H) 05/08/2023   Metabolic Disorder Labs: Lab Results  Component Value Date   HGBA1C 5.0 01/07/2024   MPG 96.8 01/07/2024   No results found for: "PROLACTIN" Lab Results  Component Value Date   CHOL 159 01/08/2024   TRIG 115 01/08/2024   HDL 52 01/08/2024   CHOLHDL 3.1 01/08/2024   VLDL 23 01/08/2024   LDLCALC 84 01/08/2024   Physical Findings: AIMS:  , ,  ,  ,    CIWA:    COWS:      Musculoskeletal: Strength & Muscle Tone: within normal limits Gait & Station: normal Patient leans: N/A  Psychiatric Specialty Exam:  Presentation  General Appearance:  Casual; Fairly Groomed  Eye Contact: Good  Speech: Clear and Coherent  Speech Volume: Normal  Handedness: Right   Mood and Affect  Mood: Anxious; Depressed  Affect: Labile   Thought Process  Thought Processes: Coherent  Descriptions of Associations:Intact  Orientation:Full (Time, Place and Person)  Thought Content:Logical  History of Schizophrenia/Schizoaffective disorder:No  Duration of Psychotic Symptoms:N/A  Hallucinations:Hallucinations: None Description of Auditory Hallucinations: NA  Ideas of Reference:-- (some paranoid ideations.)  Suicidal Thoughts:Suicidal Thoughts: No  Homicidal Thoughts:Homicidal Thoughts: -- (Showed gestures like hurting someone if & when triggered, but did not verbalize HI)   Sensorium  Memory: Immediate Good; Recent Good; Remote Good  Judgment: Fair  Insight: Fair   Art therapist  Concentration: Good  Attention Span: Good  Recall: Good  Fund of Knowledge: Fair  Language: Good   Psychomotor Activity  Psychomotor Activity:Psychomotor Activity: Normal   Assets  Assets: Communication Skills; Desire for Improvement; Physical Health; Resilience   Sleep  Sleep:Sleep: Good Number of Hours of Sleep: 9.5    Physical Exam: Physical Exam Vitals and nursing note reviewed.  HENT:     Nose: Nose normal.     Mouth/Throat:     Pharynx: Oropharynx is clear.  Cardiovascular:     Rate and Rhythm: Normal rate.     Pulses: Normal pulses.  Pulmonary:     Effort: Pulmonary effort is normal.  Genitourinary:    Comments: Deferred Musculoskeletal:        General: Normal range of motion.     Cervical back: Normal range of motion.  Skin:    General: Skin is warm and dry.  Neurological:     General: No focal deficit  present.  Mental Status: He is alert and oriented to person, place, and time.    Review of Systems  Constitutional:  Negative for chills, diaphoresis and fever.  HENT:  Negative for congestion and sore throat.   Respiratory:  Negative for cough and wheezing.   Cardiovascular:  Negative for chest pain and palpitations.  Gastrointestinal:  Negative for abdominal pain, constipation, diarrhea, heartburn, nausea and vomiting.  Musculoskeletal:  Negative for joint pain and myalgias.  Neurological:  Negative for dizziness, tingling, tremors, sensory change, speech change, focal weakness, seizures, loss of consciousness, weakness and headaches.  Psychiatric/Behavioral:  Positive for depression. Negative for suicidal ideas.    Blood pressure (!) 134/94, pulse 62, temperature 98.4 F (36.9 C), temperature source Oral, resp. rate 18, height 5\' 9"  (1.753 m), weight 95.7 kg, SpO2 95%. Body mass index is 31.16 kg/m.   Treatment Plan Summary: Daily contact with patient to assess and evaluate symptoms and progress in treatment and Medication management.  Principal/active diagnoses:  Bipolar disorder, mixed episodes.  Alcohol use disorder.  Cannabis use disorder.  Plan: The risks/benefits/side-effects/alternatives to the medications in use were discussed in detail with the patient and time was given for patient's questions. The patient consents to medication trial.   -Continue Fluoxetine 20 mg po daily for depression.  -Continue hydroxyzine 25 mg po tid prn for anxiety.  -Continue olanzapine 10 mg po Q bedtime for mood control.  -Continue Minipress 2 mg po Q bedtime for nightmares.  -Continue Drisdol 1.25 mg (50,000 units) po every 7 days for bone health.  -Continue Vit. D3 2,000 units po daily for bone health.  Agitation protocols. --Haldol 5 mg, oral, 3 times daily as needed, mild agitation --Benadryl 50 mg, oral, 3 times daily as needed, mild agitation                                      OR --Haldol injection 5 mg, IM, 3 times daily as needed, moderate agitation --Benadryl injection 50 mg, IM, 3 times daily as needed, moderate agitation --Ativan injection 2 mg, IM, 3 times daily as needed, moderate agitation                                     OR --Haldol injection 10 mg, IM, 3 times daily as needed, severe agitation --Benadryl injection 50 mg, IM, 3 times daily as needed, severe agitation --Ativan injection 2 mg, IM, 3 times daily as needed, severe agitation   Other PRNS -Continue Tylenol 650 mg every 6 hours PRN for mild pain -Continue Maalox 30 ml Q 4 hrs PRN for indigestion -Continue MOM 30 ml po Q 6 hrs for constipation  Safety and Monitoring: Voluntary admission to inpatient psychiatric unit for safety, stabilization and treatment Daily contact with patient to assess and evaluate symptoms and progress in treatment Patient's case to be discussed in multi-disciplinary team meeting Observation Level : q15 minute checks Vital signs: q12 hours Precautions: Safety  Discharge Planning: Social work and case management to assist with discharge planning and identification of hospital follow-up needs prior to discharge Estimated LOS: 5-7 days Discharge Concerns: Need to establish a safety plan; Medication compliance and effectiveness Discharge Goals: Return home with outpatient referrals for mental health follow-up including medication management/psychotherapy  Armandina Stammer, NP, pmhnp, fnp-bc. 01/11/2024, 9:10 AM

## 2024-01-10 NOTE — Group Note (Signed)
Date:  01/10/2024 Time:  11:23 AM  Group Topic/Focus:  Goals Group:   The focus of this group is to help patients establish daily goals to achieve during treatment and discuss how the patient can incorporate goal setting into their daily lives to aide in recovery. Orientation:   The focus of this group is to educate the patient on the purpose and policies of crisis stabilization and provide a format to answer questions about their admission.  The group details unit policies and expectations of patients while admitted.    Participation Level:  Active  Participation Quality:  Attentive  Affect:  Appropriate  Cognitive:  Appropriate  Insight: Appropriate  Engagement in Group:  Engaged  Modes of Intervention:  Discussion  Additional Comments:  Patient attend goals group and was attentive the duration of it. Patient's goal was to get a discharge plan and attend all groups.   Camera Krienke T Johnnae Impastato 01/10/2024, 11:23 AM

## 2024-01-10 NOTE — Progress Notes (Signed)
     01/10/2024       1:04 PM   Ramon Dredge L Kahan   Type of Note: Collateral w/ pt's Wynell Balloon with [facility] [phone] who inquired about patient discharging to a halfway house or [facility] at discharge due to having unstable housing. Alinda Money reports that he has known pt for 3 years and this is the first time he had concerns regarding pt's behavior and that's when he encouraged pt to get help and brought him to [facility].   Alinda Money may be able to pick pt up at discharge if it is a Friday evening or the weekend, otherwise pt will need a ride at discharge.   Signed:  Imunique Samad, LCSW-A 01/10/2024  1:04 PM

## 2024-01-10 NOTE — [facility] Group Notes (Signed)

## 2024-01-11 DIAGNOSIS — F3163 Bipolar disorder, current episode mixed, severe, without psychotic features: Secondary | ICD-10-CM | POA: Diagnosis not present

## 2024-01-11 LAB — VITAMIN B1: Vitamin B1 (Thiamine): 74.1 nmol/L (ref 66.5–200.0)

## 2024-01-11 MED ORDER — AMLODIPINE BESYLATE 10 MG PO TABS
10.0000 mg | ORAL_TABLET | Freq: Every day | ORAL | Status: DC
  Administered 2024-01-12 – 2024-01-16 (×5): 10 mg via ORAL
  Filled 2024-01-11 (×7): qty 1

## 2024-01-11 MED ORDER — AMLODIPINE BESYLATE 5 MG PO TABS
5.0000 mg | ORAL_TABLET | Freq: Once | ORAL | Status: AC
  Administered 2024-01-11: 5 mg via ORAL
  Filled 2024-01-11: qty 1

## 2024-01-11 MED ORDER — QUETIAPINE FUMARATE 200 MG PO TABS
200.0000 mg | ORAL_TABLET | Freq: Every day | ORAL | Status: DC
  Administered 2024-01-11: 200 mg via ORAL
  Filled 2024-01-11 (×4): qty 1

## 2024-01-11 MED ORDER — LISINOPRIL 5 MG PO TABS
5.0000 mg | ORAL_TABLET | Freq: Every day | ORAL | Status: DC
  Filled 2024-01-11 (×2): qty 1

## 2024-01-11 NOTE — Group Note (Signed)
Date:  01/11/2024 Time:  4:59 PM  Group Topic/Focus:  Social Wellness (Self and Interpersonal)    Participation Level:  Active  Participation Quality:  Appropriate  Affect:  Appropriate  Cognitive:  Appropriate  Insight: Appropriate  Engagement in Group:  Engaged  Modes of Intervention:  Activity, Problem-solving, and Socialization  Additional Comments:   Pt attended and actively participated in the Social Wellness group. Pt practiced teamwork, used problem solving, communication, and critical thinking skills to complete the social activity.  Edmund Hilda Jermale Crass 01/11/2024, 4:59 PM

## 2024-01-11 NOTE — Progress Notes (Signed)
Chaplain met with Conan to provide emotional and spiritual support around some of the stressors in his life.  Chaplain provided listening as he shared about his current living situation and relationship with his wife.  His faith is very important to him and he requested prayer, which chaplain provided.  [address], Bcc Pager, [phone]

## 2024-01-11 NOTE — Progress Notes (Signed)
   01/10/24 2007  Psych Admission Type (Psych Patients Only)  Admission Status Voluntary  Psychosocial Assessment  Patient Complaints Depression  Eye Contact Fair  Facial Expression Flat  Affect Depressed  Speech Logical/coherent  Interaction Minimal  Motor Activity Slow  Appearance/Hygiene Unremarkable  Behavior Characteristics Appropriate to situation  Mood Depressed  Thought Process  Coherency WDL  Content Blaming others  Delusions None reported or observed  Perception WDL  Hallucination None reported or observed  Judgment Poor  Confusion None  Danger to Self  Current suicidal ideation? Denies  Danger to Others  Danger to Others None reported or observed

## 2024-01-11 NOTE — Group Note (Signed)
Recreation Therapy Group Note   Group Topic:Team Building  Group Date: 01/11/2024 Start Time: 0930 End Time: 1000 Facilitators: Aloria Looper-McCall, LRT,CTRS Location: [address] Dayroom   Group Topic: Communication, Team Building, Problem Solving  Goal Area(s) Addresses:  Patient will effectively work with peer towards shared goal.  Patient will identify skills used to make activity successful.  Patient will share challenges and verbalize solution-driven approaches used. Patient will identify how skills used during activity can be used to reach post d/c goals.   Intervention: STEM Activity   Activity: [employer]. Patients were provided the following materials: 4 drinking straws, 5 rubber bands, 5 paper clips, 2 index cards, and 2 drinking cups. Using the provided materials patients were asked to build a launching mechanism to launch a ping pong ball across the room, approximately 10 feet. Patients were divided into teams of 3-5. Instructions required all materials be incorporated into the device, functionality of items left to the peer group's discretion.  Education: Pharmacist, community, Scientist, physiological, Air cabin crew, Building control surveyor.   Education Outcome: Acknowledges education/In group clarification offered/Needs additional education.    Affect/Mood: Appropriate   Participation Level: Engaged   Participation Quality: Independent   Behavior: Appropriate   Speech/Thought Process: Focused   Insight: Good   Judgement: Good   Modes of Intervention: STEM Activity   Patient Response to Interventions:  Engaged   Education Outcome:  In group clarification offered    Clinical Observations/Individualized Feedback: Pt was bright and engaged. Pt worked well with peers in coming up with ideas and working them to see which one would be successful. Pt was social and focused during group session.     Plan: Continue to engage patient in RT group sessions  2-3x/week.   Rodert Hinch-McCall, LRT,CTRS 01/11/2024 12:20 PM
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
## 2024-01-11 NOTE — [facility] Group Notes (Signed)
Adult Psychoeducational Group Note  Date:  01/11/2024 Time:  10:07 PM  Group Topic/Focus:  Wrap-Up Group:   The focus of this group is to help patients review their daily goal of treatment and discuss progress on daily workbooks.  Participation Level:  Did Not Attend  Participation Quality:   n/a  Affect:   n/a  Cognitive:   n/a  Insight: None  Engagement in Group:   n/a  Modes of Intervention:   n/a  Additional Comments:  Seth Mcintyre wrap up group.  [name] 01/11/2024, 10:07 PM

## 2024-01-11 NOTE — Progress Notes (Signed)
Seth Mcintyre Progress Note  01/11/2024 4:31 PM [name]  MRN:  [id number]  Subjective:  58 y.o. male  with a past psychiatric history of historical diagnosis of bipolar disorder and PTSD. Patient initially arrived to [facility] on 1/20 for intrusive homicidal thoughts, worsening depressed mood, and passive suicidal ideation in the setting of housing instability, and admitted to [facility] voluntarily on 1/20 for acute safety concerns, crisis stabalization, impaired functioning, homicidal behaviors, severe substance-induced psychosis or mood disturbances, and intensive therapeutic interventions.   Daily notes: Seth Mcintyre is seen. Chart reviewed. The chart findings discussed with the treatment team. He presents alert, oriented & aware of situation. He present with a restricted affect, making a fair eye contact & verbally responsive. He is visible on the unit, attending group sessions. He presents with some twitchings to his mouth area that are involuntary. He reports, "I have been feeling down since this morning. Through-out the night, I keeping waking up, falling back asleep & waking up again. I did sleep, but I did not get any rest from the sleep. I did have a headache last night that is still going on this morning. I have been trying to maintain my composure, but it is not sticking. I feel sort of restless. I try to go to the day room, but everyone is getting on my nerves in there. I'm doing okay except the sleep part, may be that is the reason I feel agitated today". During this evaluation, patient's eyes were both reddened (not conjunctivitis) & he stares intensity. He currently denies any SIHI, AVH, delusional thoughts or paranoia. It is hard to determine if he is responding to any internal stimuli with the way he was starring. Patient is encouraged to continue to maintain his composure. He is instructed & encouraged to report to the staff if  he has any concerns, feeling paranoid, feels irritated or agitated. He is  encouraged to go ask the nurses for some medicine for his headache. Discussed this with the attending psychiatrist. His olanzapine is discontinued due the twitching of the mouth, patient is started on Seroquel 200 mg po Q hs. He is instructed & encouraged to report any side effects to the RN. His amlodipine is increased to 10 mg due to elevated blood pressure readings.  Seth Mcintyre He reports, "I  Principal Problem: Bipolar 1 disorder, mixed, severe (HCC)  Diagnosis: Principal Problem:   Bipolar 1 disorder, mixed, severe (HCC)  Total Time spent with patient: 45 minutes  Past Psychiatric History: See H&P  Past Medical History:  Past Medical History:  Diagnosis Date   Bipolar disorder (HCC)    H/O psychiatric hospitalization    Homicidal ideation    PTSD (post-traumatic stress disorder)    Suicidal ideation     Past Surgical History:  Procedure Laterality Date   APPENDECTOMY     Family History:  Family History  Family history unknown: Yes   Family Psychiatric  History: See H&P  Social History:  Social History   Substance and Sexual Activity  Alcohol Use Yes   Alcohol/week: 8.0 standard drinks of alcohol   Types: 8 Cans of beer per week     Social History   Substance and Sexual Activity  Drug Use Yes   Types: Marijuana    Social History   Socioeconomic History   Marital status: Married    Spouse name: Seth Mcintyre   Number of children: Not on file   Years of education: Not on file   Highest education level:  Not on file  Occupational History   Not on file  Tobacco Use   Smoking status: Never   Smokeless tobacco: Never  Vaping Use   Vaping status: Never Used  Substance and Sexual Activity   Alcohol use: Yes    Alcohol/week: 8.0 standard drinks of alcohol    Types: 8 Cans of beer per week   Drug use: Yes    Types: Marijuana   Sexual activity: Yes  Other Topics Concern   Not on file  Social History Narrative   Not on file   Social Drivers of Health    Financial Resource Strain: Low Risk  (09/30/2019)   Overall Financial Resource Strain (CARDIA)    Difficulty of Paying Living Expenses: Not hard at all  Food Insecurity: Food Insecurity Present (01/07/2024)   Hunger Vital Sign    Worried About Running Out of Food in the Last Year: Often true    Ran Out of Food in the Last Year: Often true  Transportation Needs: No Transportation Needs (01/07/2024)   PRAPARE - Administrator, Civil Service (Medical): No    Lack of Transportation (Non-Medical): No  Physical Activity: Insufficiently Active (09/30/2019)   Exercise Vital Sign    Days of Exercise per Week: 2 days    Minutes of Exercise per Session: 30 min  Stress: No Stress Concern Present (09/30/2019)   [employer] of Occupational Health - Occupational Stress Questionnaire    Feeling of Stress : Not at all  Social Connections: Moderately Isolated (05/10/2023)   Social Connection and Isolation Panel [NHANES]    Frequency of Communication with Friends and Family: Once a week    Frequency of Social Gatherings with Friends and Family: Never    Attends Religious Services: More than 4 times per year    Active Member of [employer] or Organizations: No    Attends Banker Meetings: Never    Marital Status: Married   Additional Social History:   Sleep: Good  Appetite:  Good  Current Medications: Current Facility-Administered Medications  Medication Dose Route Frequency Provider Last Rate Last Admin   acetaminophen (TYLENOL) tablet 650 mg  650 mg Oral Q6H PRN Margaretmary Dys, Mcintyre   650 mg at 01/11/24 1111   alum & mag hydroxide-simeth (MAALOX/MYLANTA) 200-200-20 MG/5ML suspension 30 mL  30 mL Oral Q4H PRN Margaretmary Dys, Mcintyre       [START ON 01/12/2024] amLODipine (NORVASC) tablet 10 mg  10 mg Oral Daily Kynzlie Hilleary I, NP       haloperidol (HALDOL) tablet 5 mg  5 mg Oral TID PRN Margaretmary Dys, Mcintyre       And    diphenhydrAMINE (BENADRYL) capsule 50 mg  50 mg Oral TID PRN Margaretmary Dys, Mcintyre       haloperidol lactate (HALDOL) injection 5 mg  5 mg Intramuscular TID PRN Margaretmary Dys, Mcintyre       And   diphenhydrAMINE (BENADRYL) injection 50 mg  50 mg Intramuscular TID PRN Margaretmary Dys, Mcintyre       And   LORazepam (ATIVAN) injection 2 mg  2 mg Intramuscular TID PRN Margaretmary Dys, Mcintyre       haloperidol lactate (HALDOL) injection 10 mg  10 mg Intramuscular TID PRN Margaretmary Dys, Mcintyre       And   diphenhydrAMINE (BENADRYL) injection 50 mg  50 mg Intramuscular TID PRN Carlyon Shadow  Stallworth, Mcintyre       And   LORazepam (ATIVAN) injection 2 mg  2 mg Intramuscular TID PRN Margaretmary Dys, Mcintyre       FLUoxetine (PROZAC) capsule 20 mg  20 mg Oral Daily Margaretmary Dys, Mcintyre   20 mg at 01/11/24 9528   hydrOXYzine (ATARAX) tablet 25 mg  25 mg Oral TID PRN Margaretmary Dys, Mcintyre   25 mg at 01/09/24 2120   magnesium hydroxide (MILK OF MAGNESIA) suspension 30 mL  30 mL Oral Daily PRN Armandina Stammer I, NP       OLANZapine (ZYPREXA) tablet 10 mg  10 mg Oral Devoria Glassing, Mcintyre   10 mg at 01/10/24 2110   prazosin (MINIPRESS) capsule 2 mg  2 mg Oral QHS Margaretmary Dys, Mcintyre   2 mg at 01/10/24 2110   Vitamin D (Ergocalciferol) (DRISDOL) 1.25 MG (50000 UNIT) capsule 50,000 Units  50,000 Units Oral Q7 days Golda Acre, Mcintyre   50,000 Units at 01/09/24 1024   vitamin D3 (CHOLECALCIFEROL) tablet 2,000 Units  2,000 Units Oral Daily Golda Acre, Mcintyre   2,000 Units at 01/11/24 0732   Lab Results:  No results found for this or any previous visit (from the past 48 hours).  Blood Alcohol level:  Lab Results  Component Value Date   ETH <10 01/07/2024   ETH 108 (H) 05/08/2023   Metabolic Disorder Labs: Lab Results  Component Value Date   HGBA1C 5.0 01/07/2024   MPG 96.8  01/07/2024   No results found for: "PROLACTIN" Lab Results  Component Value Date   CHOL 159 01/08/2024   TRIG 115 01/08/2024   HDL 52 01/08/2024   CHOLHDL 3.1 01/08/2024   VLDL 23 01/08/2024   LDLCALC 84 01/08/2024   Physical Findings: AIMS:  , ,  ,  ,    CIWA:    COWS:     Musculoskeletal: Strength & Muscle Tone: within normal limits Gait & Station: normal Patient leans: N/A  Psychiatric Specialty Exam:  Presentation  General Appearance:  Casual; Fairly Groomed  Eye Contact: Good  Speech: Clear and Coherent  Speech Volume: Normal  Handedness: Right   Mood and Affect  Mood: Anxious; Depressed  Affect: Labile   Thought Process  Thought Processes: Coherent  Descriptions of Associations:Intact  Orientation:Full (Time, Place and Person)  Thought Content:Logical  History of Schizophrenia/Schizoaffective disorder:No  Duration of Psychotic Symptoms:N/A  Hallucinations:Hallucinations: None Description of Auditory Hallucinations: NA  Ideas of Reference:-- (some paranoid ideations.)  Suicidal Thoughts:Suicidal Thoughts: No  Homicidal Thoughts:Homicidal Thoughts: -- (Showed gestures like hurting someone if & when triggered, but did not verbalize HI)   Sensorium  Memory: Immediate Good; Recent Good; Remote Good  Judgment: Fair  Insight: Fair   Art therapist  Concentration: Good  Attention Span: Good  Recall: Good  Fund of Knowledge: Fair  Language: Good   Psychomotor Activity  Psychomotor Activity:Psychomotor Activity: Normal   Assets  Assets: Communication Skills; Desire for Improvement; Physical Health; Resilience   Sleep  Sleep:Sleep: Good Number of Hours of Sleep: 9.5    Physical Exam: Physical Exam Vitals and nursing note reviewed.  HENT:     Nose: Nose normal.     Mouth/Throat:     Pharynx: Oropharynx is clear.  Cardiovascular:     Rate and Rhythm: Normal rate.     Pulses: Normal pulses.   Pulmonary:     Effort: Pulmonary effort is normal.  Genitourinary:  Comments: Deferred Musculoskeletal:        General: Normal range of motion.     Cervical back: Normal range of motion.  Skin:    General: Skin is warm and dry.  Neurological:     General: No focal deficit present.     Mental Status: He is alert and oriented to person, place, and time.    Review of Systems  Constitutional:  Negative for chills, diaphoresis and fever.  HENT:  Negative for congestion and sore throat.   Respiratory:  Negative for cough and wheezing.   Cardiovascular:  Negative for chest pain and palpitations.  Gastrointestinal:  Negative for abdominal pain, constipation, diarrhea, heartburn, nausea and vomiting.  Musculoskeletal:  Negative for joint pain and myalgias.  Neurological:  Negative for dizziness, tingling, tremors, sensory change, speech change, focal weakness, seizures, loss of consciousness, weakness and headaches.  Psychiatric/Behavioral:  Positive for depression. Negative for suicidal ideas.    Blood pressure (!) 144/90, pulse 69, temperature 98.4 F (36.9 C), temperature source Oral, resp. rate 18, height 5\' 9"  (1.753 m), weight 95.7 kg, SpO2 95%. Body mass index is 31.16 kg/m.   Treatment Plan Summary: Daily contact with patient to assess and evaluate symptoms and progress in treatment and Medication management.  Principal/active diagnoses:  Bipolar disorder, mixed episodes.  Alcohol use disorder.  Cannabis use disorder.  Plan: The risks/benefits/side-effects/alternatives to the medications in use were discussed in detail with the patient and time was given for patient's questions. The patient consents to medication trial.   -Continue Fluoxetine 20 mg po daily for depression.  -Continue hydroxyzine 25 mg po tid prn for anxiety.  -Discontinued olanzapine 10 mg po Q bedtime for mood control.  -Initiated Seroquel 200 mg po Q bedtime for mood control. -Continue Minipress 2 mg  po Q bedtime for nightmares.  -Continue Drisdol 1.25 mg (50,000 units) po every 7 days for bone health.  -Continue Vit. D3 2,000 units po daily for bone health.  -Increased amlodipine to 10 mg po daily for HTN.  Agitation protocols. --Haldol 5 mg, oral, 3 times daily as needed, mild agitation --Benadryl 50 mg, oral, 3 times daily as needed, mild agitation                                     OR --Haldol injection 5 mg, IM, 3 times daily as needed, moderate agitation --Benadryl injection 50 mg, IM, 3 times daily as needed, moderate agitation --Ativan injection 2 mg, IM, 3 times daily as needed, moderate agitation                                     OR --Haldol injection 10 mg, IM, 3 times daily as needed, severe agitation --Benadryl injection 50 mg, IM, 3 times daily as needed, severe agitation --Ativan injection 2 mg, IM, 3 times daily as needed, severe agitation   Other PRNS -Continue Tylenol 650 mg every 6 hours PRN for mild pain -Continue Maalox 30 ml Q 4 hrs PRN for indigestion -Continue MOM 30 ml po Q 6 hrs for constipation  Safety and Monitoring: Voluntary admission to inpatient psychiatric unit for safety, stabilization and treatment Daily contact with patient to assess and evaluate symptoms and progress in treatment Patient's case to be discussed in multi-disciplinary team meeting Observation Level : q15 minute checks Vital signs: q12  hours Precautions: Safety  Discharge Planning: Social work and case management to assist with discharge planning and identification of hospital follow-up needs prior to discharge Estimated LOS: 5-7 days Discharge Concerns: Need to establish a safety plan; Medication compliance and effectiveness Discharge Goals: Return home with outpatient referrals for mental health follow-up including medication management/psychotherapy  Armandina Stammer, NP, pmhnp, fnp-bc. 01/11/2024, 4:31 PM Patient ID: Seth Mcintyre, male   DOB: 1966-09-14, 58 y.o.   MRN:  [id number]

## 2024-01-11 NOTE — Group Note (Signed)
Date:  01/11/2024 Time:  10:06 PM  Group Topic/Focus:  Wrap-Up Group:   The focus of this group is to help patients review their daily goal of treatment and discuss progress on daily workbooks.    Participation Level:  Did Not Attend  Scot Dock 01/11/2024, 10:06 PM

## 2024-01-11 NOTE — Progress Notes (Addendum)
Pt denied SI/AH this morning. Pt endorsed passive HI reporting "I get paranoid around a lot of people and sometimes have HI so I keep to myself". Pt endorsed VH stating "I see spots in my vision". Pt rated his depression a 7/10, anxiety a 7/10, and feelings of hopelessness a 10/10. Pt reports that he slept "fair" last night. Pt has been calm and cooperative throughout the shift. RN provided support and encouragement to patient. Pt given scheduled medications as prescribed. Q15 min checks verified for safety. Patient verbally contracts for safety. Patient compliant with medications and treatment plan. Patient is interacting well on the unit. Pt is safe on the unit.   01/11/24 0900  Psych Admission Type (Psych Patients Only)  Admission Status Voluntary  Psychosocial Assessment  Patient Complaints Sleep disturbance;Other (Comment) ("sluggish")  Eye Contact Fair  Facial Expression Flat;Sad  Affect Depressed;Sad  Speech Logical/coherent  Interaction Minimal;Isolative  Motor Activity Slow  Appearance/Hygiene Unremarkable  Behavior Characteristics Cooperative  Mood Depressed;Sad  Thought Process  Coherency WDL  Content WDL  Delusions WDL  Perception WDL  Hallucination Visual ("I see spots in my vision")  Judgment Impaired  Confusion None  Danger to Self  Current suicidal ideation? Denies  Danger to Others  Danger to Others Reported or observed (Pt reports "I have HI and am paranoid when I am around a lot of people and so I keep to myself")  Danger to Others Abnormal  Harmful Behavior to others No threats or harm toward other people  Destructive Behavior No threats or harm toward property

## 2024-01-11 NOTE — Group Note (Signed)
Date:  01/11/2024 Time:  4:36 PM  Group Topic/Focus:  Goals Group:   The focus of this group is to help patients establish daily goals to achieve during treatment and discuss how the patient can incorporate goal setting into their daily lives to aide in recovery. Orientation:   The focus of this group is to educate the patient on the purpose and policies of crisis stabilization and provide a format to answer questions about their admission.  The group details unit policies and expectations of patients while admitted.    Participation Level:  Active  Participation Quality:  Appropriate  Affect:  Appropriate  Cognitive:  Appropriate  Insight: Appropriate  Engagement in Group:  Engaged  Modes of Intervention:  Discussion, Orientation, and Rapport Building  Additional Comments:   Pt actively participated in the Orientation and Goals group. Pt shared his frustrations with his  family and support system. Pt receptive to support and encouragement. Pt personal goal for today is to practice putting himself first.  Stark Bray 01/11/2024, 4:36 PM

## 2024-01-11 NOTE — Plan of Care (Signed)
Problem: Education: Goal: Emotional status will improve Outcome: Progressing Goal: Mental status will improve Outcome: Progressing   Problem: Activity: Goal: Interest or engagement in activities will improve Outcome: Progressing Goal: Sleeping patterns will improve Outcome: Progressing

## 2024-01-12 DIAGNOSIS — F3163 Bipolar disorder, current episode mixed, severe, without psychotic features: Secondary | ICD-10-CM | POA: Diagnosis not present

## 2024-01-12 MED ORDER — QUETIAPINE FUMARATE 300 MG PO TABS
300.0000 mg | ORAL_TABLET | Freq: Every day | ORAL | Status: DC
  Administered 2024-01-12 – 2024-01-13 (×2): 300 mg via ORAL
  Filled 2024-01-12 (×4): qty 1

## 2024-01-12 NOTE — Progress Notes (Signed)
[facility] MD Progress Note  01/12/2024 2:58 PM Seth Mcintyre  MRN:  [id number] Subjective:   Seth Mcintyre is a 58 yr old male who presented on 1/20 to [facility] with his pastor due to increasing mood and behavior disturbances, he was admitted to [facility] on 1/21.  PPHx is significant for Bipolar Disorder, PTSD, and Polysubstance Abuse, and 3 Suicide Attempts (last- OD fentanyl 2024) and Multiple Psychiatric Hospitalizations.   Case was discussed in the multidisciplinary team. MAR was reviewed and patient was compliant with medications.  He received PRN Tylenol and Hydroxyzine yesterday.   Psychiatric Team made the following recommendations yesterday: -Stop Zyprexa -Continue Prozac 20 mg daily for depression -Start Seroquel 200 mg QHS for mood stability and psychosis -Continue Prazosin 2 mg QHS for night terrors -Increase Amlodipine to 10 mg daily   On interview today patient reports he slept poor last night, he reports he would wake up every time his roommate moved.  He reports his appetite is doing good.  He reports no SI.  Seth Mcintyre asked if he has HI he reports this is why he isolates to his room.  He reports having AVH.  He reports having Paranoia.  He reports no issues with his medications.  He reports that he is still hearing both Eddie (bad) and Duffy (good).  He reports that when it gets too overwhelming for him he returns to his room.  He reports having some arthritis pain in his right hand but otherwise reports no other concerns at present.  Principal Problem: Bipolar 1 disorder, mixed, severe (HCC) Diagnosis: Principal Problem:   Bipolar 1 disorder, mixed, severe (HCC)  Total Time spent with patient:  I personally spent 35 minutes on the unit in direct patient care. The direct patient care time included face-to-face time with the patient, reviewing the patient's chart, communicating with other professionals, and coordinating care. Greater than 50% of this time was spent in counseling or  coordinating care with the patient regarding goals of hospitalization, psycho-education, and discharge planning needs.   Past Psychiatric History: Bipolar Disorder, PTSD, and Polysubstance Abuse, and 3 Suicide Attempts (last- OD fentanyl 2024) and Multiple Psychiatric Hospitalizations.  Past Medical History:  Past Medical History:  Diagnosis Date   Bipolar disorder (HCC)    H/O psychiatric hospitalization    Homicidal ideation    PTSD (post-traumatic stress disorder)    Suicidal ideation     Past Surgical History:  Procedure Laterality Date   APPENDECTOMY     Family History:  Family History  Family history unknown: Yes   Family Psychiatric  History:  Mother and Father- EtOH Abuse  Social History:  Social History   Substance and Sexual Activity  Alcohol Use Yes   Alcohol/week: 8.0 standard drinks of alcohol   Types: 8 Cans of beer per week     Social History   Substance and Sexual Activity  Drug Use Yes   Types: Marijuana    Social History   Socioeconomic History   Marital status: Married    Spouse name: Metallurgist   Number of children: Not on file   Years of education: Not on file   Highest education level: Not on file  Occupational History   Not on file  Tobacco Use   Smoking status: Never   Smokeless tobacco: Never  Vaping Use   Vaping status: Never Used  Substance and Sexual Activity   Alcohol use: Yes    Alcohol/week: 8.0 standard drinks of alcohol  Types: 8 Cans of beer per week   Drug use: Yes    Types: Marijuana   Sexual activity: Yes  Other Topics Concern   Not on file  Social History Narrative   Not on file   Social Drivers of Health   Financial Resource Strain: Low Risk  (09/30/2019)   Overall Financial Resource Strain (CARDIA)    Difficulty of Paying Living Expenses: Not hard at all  Food Insecurity: Food Insecurity Present (01/07/2024)   Hunger Vital Sign    Worried About Running Out of Food in the Last Year: Often true     Ran Out of Food in the Last Year: Often true  Transportation Needs: No Transportation Needs (01/07/2024)   PRAPARE - Administrator, Civil Service (Medical): No    Lack of Transportation (Non-Medical): No  Physical Activity: Insufficiently Active (09/30/2019)   Exercise Vital Sign    Days of Exercise per Week: 2 days    Minutes of Exercise per Session: 30 min  Stress: No Stress Concern Present (09/30/2019)   [employer] of Occupational Health - Occupational Stress Questionnaire    Feeling of Stress : Not at all  Social Connections: Moderately Isolated (05/10/2023)   Social Connection and Isolation Panel [NHANES]    Frequency of Communication with Friends and Family: Once a week    Frequency of Social Gatherings with Friends and Family: Never    Attends Religious Services: More than 4 times per year    Active Member of [employer] or Organizations: No    Attends Engineer, structural: Never    Marital Status: Married   Additional Social History:                         Sleep: Poor 8 hrs per report, per pt woke up every time roommate moved  Appetite:  Good  Current Medications: Current Facility-Administered Medications  Medication Dose Route Frequency Provider Last Rate Last Admin   acetaminophen (TYLENOL) tablet 650 mg  650 mg Oral Q6H PRN Margaretmary Dys, MD   650 mg at 01/12/24 0759   alum & mag hydroxide-simeth (MAALOX/MYLANTA) 200-200-20 MG/5ML suspension 30 mL  30 mL Oral Q4H PRN Margaretmary Dys, MD       amLODipine (NORVASC) tablet 10 mg  10 mg Oral Daily Armandina Stammer I, NP   10 mg at 01/12/24 0756   haloperidol (HALDOL) tablet 5 mg  5 mg Oral TID PRN Margaretmary Dys, MD       And   diphenhydrAMINE (BENADRYL) capsule 50 mg  50 mg Oral TID PRN Margaretmary Dys, MD       haloperidol lactate (HALDOL) injection 5 mg  5 mg Intramuscular TID PRN Margaretmary Dys, MD       And    diphenhydrAMINE (BENADRYL) injection 50 mg  50 mg Intramuscular TID PRN Margaretmary Dys, MD       And   LORazepam (ATIVAN) injection 2 mg  2 mg Intramuscular TID PRN Margaretmary Dys, MD       haloperidol lactate (HALDOL) injection 10 mg  10 mg Intramuscular TID PRN Margaretmary Dys, MD       And   diphenhydrAMINE (BENADRYL) injection 50 mg  50 mg Intramuscular TID PRN Margaretmary Dys, MD       And   LORazepam (ATIVAN) injection 2 mg  2 mg Intramuscular TID  PRN Margaretmary Dys, MD       FLUoxetine (PROZAC) capsule 20 mg  20 mg Oral Daily Margaretmary Dys, MD   20 mg at 01/12/24 0160   hydrOXYzine (ATARAX) tablet 25 mg  25 mg Oral TID PRN Margaretmary Dys, MD   25 mg at 01/09/24 2120   magnesium hydroxide (MILK OF MAGNESIA) suspension 30 mL  30 mL Oral Daily PRN Armandina Stammer I, NP       prazosin (MINIPRESS) capsule 2 mg  2 mg Oral QHS Margaretmary Dys, MD   2 mg at 01/11/24 2134   QUEtiapine (SEROQUEL) tablet 300 mg  300 mg Oral QHS [name], MD       Vitamin D (Ergocalciferol) (DRISDOL) 1.25 MG (50000 UNIT) capsule 50,000 Units  50,000 Units Oral Q7 days Golda Acre, MD   50,000 Units at 01/09/24 1024   vitamin D3 (CHOLECALCIFEROL) tablet 2,000 Units  2,000 Units Oral Daily Golda Acre, MD   2,000 Units at 01/12/24 1093    Lab Results: No results found for this or any previous visit (from the past 48 hours).  Blood Alcohol level:  Lab Results  Component Value Date   ETH <10 01/07/2024   ETH 108 (H) 05/08/2023    Metabolic Disorder Labs: Lab Results  Component Value Date   HGBA1C 5.0 01/07/2024   MPG 96.8 01/07/2024   No results found for: "PROLACTIN" Lab Results  Component Value Date   CHOL 159 01/08/2024   TRIG 115 01/08/2024   HDL 52 01/08/2024   CHOLHDL 3.1 01/08/2024   VLDL 23 01/08/2024   LDLCALC 84 01/08/2024    Physical  Findings: AIMS:  , ,  ,  ,    CIWA:    COWS:     Musculoskeletal: Strength & Muscle Tone: within normal limits Gait & Station: normal Patient leans: N/A  Psychiatric Specialty Exam:  Presentation  General Appearance:  Appropriate for Environment; Casual  Eye Contact: Fair  Speech: Clear and Coherent; Normal Rate  Speech Volume: Normal  Handedness: Right   Mood and Affect  Mood: Dysphoric; Anxious  Affect: Constricted   Thought Process  Thought Processes: Coherent  Descriptions of Associations:Intact  Orientation:Full (Time, Place and Person)  Thought Content:Logical; WDL  History of Schizophrenia/Schizoaffective disorder:No  Duration of Psychotic Symptoms:N/A  Hallucinations:Hallucinations: Auditory; Visual Description of Auditory Hallucinations: people talking Description of Visual Hallucinations: dark spots  Ideas of Reference:Paranoia  Suicidal Thoughts:Suicidal Thoughts: No  Homicidal Thoughts:Homicidal Thoughts: -- (states this is why he isolates but does not admit to any)   Sensorium  Memory: Immediate Good; Recent Good  Judgment: Fair  Insight: Fair   Art therapist  Concentration: Good  Attention Span: Good  Recall: Good  Fund of Knowledge: Fair  Language: Good   Psychomotor Activity  Psychomotor Activity: Psychomotor Activity: Normal   Assets  Assets: Communication Skills; Desire for Improvement; Resilience; Physical Health   Sleep  Sleep: Sleep: Poor (wakes up whenever roommate moves) Number of Hours of Sleep: 8    Physical Exam: Physical Exam Vitals and nursing note reviewed.  Constitutional:      General: He is not in acute distress.    Appearance: Normal appearance. He is normal weight. He is not ill-appearing or toxic-appearing.  HENT:     Head: Normocephalic and atraumatic.  Pulmonary:     Effort: Pulmonary effort is normal.  Musculoskeletal:        General: Normal range of  motion.  Neurological:     General: No focal deficit present.     Mental Status: He is alert.    Review of Systems  Respiratory:  Negative for cough and shortness of breath.   Cardiovascular:  Negative for chest pain.  Gastrointestinal:  Negative for abdominal pain, constipation, diarrhea, nausea and vomiting.  Neurological:  Negative for dizziness, weakness and headaches.  Psychiatric/Behavioral:  Positive for depression and hallucinations. Negative for suicidal ideas. The patient is nervous/anxious and has insomnia.    Blood pressure (!) 140/84, pulse 69, temperature 98.2 F (36.8 C), temperature source Oral, resp. rate 16, height 5\' 9"  (1.753 m), weight 95.7 kg, SpO2 98%. Body mass index is 31.16 kg/m.   Treatment Plan Summary: Daily contact with patient to assess and evaluate symptoms and progress in treatment and Medication management  Seth Mcintyre is a 58 yr old male who presented on 1/20 to [facility] with his pastor due to increasing mood and behavior disturbances, he was admitted to [facility] on 1/21.  PPHx is significant for Bipolar Disorder, PTSD, and Polysubstance Abuse, and 3 Suicide Attempts (last- OD fentanyl 2024) and Multiple Psychiatric Hospitalizations.   Ronny has tolerated starting the Seroquel last night but not noticed much improvement at this time.  He continues to have significant paranoia and AVH- two versions of himself telling him to do things.  We will increase his Seroquel this evening to address this.  We will also place a no roommate order as this is the source of his poor sleep per his report and he does have a history of aggression.  We will not make any other changes medication at this time.  We will continue to monitor.   Bipolar Disorder  PTSD: -Continue Prozac 20 mg daily for depression -Increase Seroquel to 300 mg QHS for mood stability and psychosis -Continue Prazosin 2 mg QHS for night terrors -Continue Agitation Protocol:  Haldol/Ativan/Benadryl -Start No Roommate Order   -Continue Ergocalciferol 50000 units q7 days -Continue Cholecalciferol 2000 units daily -Continue Amlodipine 10 mg daily -Continue PRN's: Tylenol, Maalox, Atarax, Milk of Magnesia   [name], MD 01/12/2024, 2:58 PM

## 2024-01-12 NOTE — Group Note (Signed)
Date:  01/12/2024 Time:  1:35 PM  Group Topic/Focus:  Goals Group:   The focus of this group is to help patients establish daily goals to achieve during treatment and discuss how the patient can incorporate goal setting into their daily lives to aide in recovery. Orientation:   The focus of this group is to educate the patient on the purpose and policies of crisis stabilization and provide a format to answer questions about their admission.  The group details unit policies and expectations of patients while admitted.    Participation Level:  Active  Participation Quality:  Appropriate  Affect:  Appropriate  Cognitive:  Appropriate  Insight: Appropriate  Engagement in Group:  Engaged  Modes of Intervention:  Discussion, Orientation, and Rapport Building  Additional Comments:   Pt attended the Orientation and Goals group. Pt shared concerns about his medication during orientation. Pt agreed to talk with his provider about medication changes. Pt personal goal for today is to remain hopeful and positive.   Seth Mcintyre 01/12/2024, 1:35 PM

## 2024-01-12 NOTE — Progress Notes (Signed)
   01/12/24 0900  Psych Admission Type (Psych Patients Only)  Admission Status Voluntary  Psychosocial Assessment  Patient Complaints None  Eye Contact Fair  Facial Expression Flat  Affect Depressed  Speech Logical/coherent  Interaction Minimal  Motor Activity Slow  Appearance/Hygiene Unremarkable  Behavior Characteristics Cooperative  Mood Depressed  Thought Process  Coherency WDL  Content WDL  Delusions WDL  Perception WDL  Hallucination None reported or observed  Judgment Poor  Confusion None  Danger to Self  Current suicidal ideation? Denies  Danger to Others  Danger to Others None reported or observed  Danger to Others Abnormal  Harmful Behavior to others No threats or harm toward other people

## 2024-01-12 NOTE — Progress Notes (Signed)
   01/12/24 0558  15 Minute Checks  Location Bedroom  Visual Appearance Calm  Behavior Sleeping  Sleep (Behavioral Health Patients Only)  Calculate sleep? (Click Yes once per 24 hr at 0600 safety check) Yes  Documented sleep last 24 hours 8

## 2024-01-12 NOTE — Group Note (Signed)
Date:  01/12/2024 Time:  9:36 PM  Group Topic/Focus:  Wrap-Up Group:   The focus of this group is to help patients review their daily goal of treatment and discuss progress on daily workbooks.    Participation Level:  Active  Participation Quality:  Appropriate and Attentive  Affect:  Appropriate  Cognitive:  Appropriate  Insight: Appropriate and Good  Engagement in Group:  Engaged and Improving  Modes of Intervention:  Discussion  Additional Comments:  Patient  volunteered to start group today.  Patient was in good spirits today.  Patient stated that he had a 7 out of 10 day.  Patient stated that he is trying to be patient while they are adjusting his medication.  Patient said is his goal.   Seth Mcintyre 01/12/2024, 9:36 PM

## 2024-01-12 NOTE — Plan of Care (Signed)
Problem: Education: Goal: Knowledge of [facility] Education information/materials will improve Outcome: Progressing Goal: Verbalization of understanding the information provided will improve Outcome: Progressing

## 2024-01-12 NOTE — Group Note (Signed)
Date:  01/12/2024 Time:  2:06 PM  Group Topic/Focus:  Dimensions of Wellness:   The focus of this group is to introduce the topic of wellness and discuss the role each dimension of wellness plays in total health.   Participation Level:  Active  Participation Quality:  Appropriate  Affect:  Appropriate  Cognitive:  Appropriate  Insight: Appropriate  Engagement in Group:  Engaged  Modes of Intervention:  Discussion and Education  Additional Comments:   Pt attended the Social Wellness group. Pt was quiet, calm and cooperative as MHT provided education of the definition of social wellness, benefits and activities to increase social wellbeing.   Seth Mcintyre 01/12/2024, 2:06 PM

## 2024-01-13 DIAGNOSIS — F3163 Bipolar disorder, current episode mixed, severe, without psychotic features: Secondary | ICD-10-CM | POA: Diagnosis not present

## 2024-01-13 MED ORDER — FLUOXETINE HCL 20 MG PO CAPS
30.0000 mg | ORAL_CAPSULE | Freq: Every day | ORAL | Status: DC
  Administered 2024-01-14 – 2024-01-16 (×3): 30 mg via ORAL
  Filled 2024-01-13 (×5): qty 1

## 2024-01-13 MED ORDER — FLUOXETINE HCL 10 MG PO CAPS
10.0000 mg | ORAL_CAPSULE | Freq: Once | ORAL | Status: AC
  Administered 2024-01-13: 10 mg via ORAL
  Filled 2024-01-13 (×2): qty 1

## 2024-01-13 NOTE — [facility] Group Notes (Signed)
[facility] LCSW Group Therapy Note  Date/Time:  01/13/2024/ 10:15-11:00  Type of Therapy and Topic:  Group Therapy:  Trust & Honesty   Participation Level:  Active   Description of Group In this group patient will be asked to explore value of being honest. Patient will be guided to discuss their thoughts, feelings, and behaviors related to honesty and trusting in others. Patient will process together how trust and honesty relate to how we form relationships with peers, family member, and self. Each patient will be challenged to identify and express feelings of being vulnerable. Patient will discuss reasons why people are dishonest and identify alternative outcomes if one was truthful to self or others). This group will be process-oriented, with patients participating in explorations of their own experiences as well as giving and receiving support and challenged from other groups members.   Therapeutic Goals Patient will identify why honesty is important to relationships and how honesty overall affects relationships. Patient will identify a situation where they lied or were lied too and the feelings, thought process, and behaviors surrounding the situation. Patient will identify the meaning of being vulnerable, how that feels, and how that correlate to being honest with self and others. Patient will identify situations where they could have told the truth, but instead lied and explain reasons of dishonesty.   Summary of Patient Progress:   Patient share that it is hard to trust people. Being honest can build trust and share that depending on the background they are from its hard to trust anyone. Patient share that he does he go in a relationship with dishonesty and see their reaction to know if that person will be honest with him.    Therapeutic Modalities Cognitive Behavioral Therapy Motivational Interviewing

## 2024-01-13 NOTE — Plan of Care (Signed)
Problem: Safety: Goal: Periods of time without injury will increase Outcome: Progressing
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
## 2024-01-13 NOTE — Group Note (Signed)
Date:  01/13/2024 Time:  9:08 AM  Group Topic/Focus:  Goals Group:   The focus of this group is to help patients establish daily goals to achieve during treatment and discuss how the patient can incorporate goal setting into their daily lives to aide in recovery. Orientation:   The focus of this group is to educate the patient on the purpose and policies of crisis stabilization and provide a format to answer questions about their admission.  The group details unit policies and expectations of patients while admitted.    Participation Level:  Active  Participation Quality:  Appropriate  Affect:  Appropriate  Cognitive:  Appropriate  Insight: Appropriate  Engagement in Group:  Engaged  Modes of Intervention:  Discussion  Additional Comments:    Tanazia Achee D Samanth Mirkin 01/13/2024, 9:08 AM

## 2024-01-13 NOTE — Plan of Care (Signed)
  Problem: Education: Goal: Knowledge of [facility] Education information/materials will improve Outcome: Progressing Goal: Emotional status will improve Outcome: Progressing Goal: Mental status will improve Outcome: Progressing Goal: Verbalization of understanding the information provided will improve Outcome: Progressing   Problem: Activity: Goal: Interest or engagement in activities will improve Outcome: Progressing   Problem: Coping: Goal: Ability to demonstrate self-control will improve Outcome: Progressing   Problem: Safety: Goal: Periods of time without injury will increase Outcome: Progressing

## 2024-01-13 NOTE — Group Note (Signed)
Date:  01/13/2024 Time:  11:47 AM  Group Topic/Focus:  Coping With Mental Health Crisis:   The purpose of this group is to help patients identify strategies for coping with mental health crisis.  Group discusses possible causes of crisis and ways to manage them effectively.    Participation Level:  Active  Participation Quality:  Appropriate, Attentive, and Sharing  Affect:  Appropriate  Cognitive:  Appropriate  Insight: Appropriate  Engagement in Group:  Engaged  Modes of Intervention:  Education  Additional Comments:    Arnoldo Hooker 01/13/2024, 11:47 AM

## 2024-01-13 NOTE — Progress Notes (Signed)
   01/12/24 2055  Psych Admission Type (Psych Patients Only)  Admission Status Voluntary  Psychosocial Assessment  Patient Complaints Irritability  Eye Contact Fair  Facial Expression Flat  Affect Anxious;Irritable  Speech Logical/coherent  Interaction Assertive  Motor Activity Slow  Appearance/Hygiene Unremarkable  Behavior Characteristics Cooperative  Mood Depressed  Thought Process  Coherency WDL  Content WDL  Delusions None reported or observed  Perception Hallucinations  Hallucination Auditory  Judgment Impaired  Confusion None  Danger to Self  Current suicidal ideation? Denies  Danger to Others  Danger to Others None reported or observed  Danger to Others Abnormal  Harmful Behavior to others No threats or harm toward other people

## 2024-01-13 NOTE — Group Note (Signed)
Date:  01/13/2024 Time:  9:42 PM  Group Topic/Focus:  Wrap-Up Group:   The focus of this group is to help patients review their daily goal of treatment and discuss progress on daily workbooks.    Participation Level:  Active  Participation Quality:  Appropriate and Attentive  Affect:  Appropriate  Cognitive:  Appropriate  Insight: Appropriate and Good  Engagement in Group:  engaged  Modes of Intervention:  Discussion  Additional Comments:  Patient stated that he had a good day..  Stated that he feels like he is adjusting to his meds.  Patient stated that he had a 8 out of 10 day  Seth Mcintyre 01/13/2024, 9:42 PM

## 2024-01-13 NOTE — Progress Notes (Signed)
D: Patient is alert, oriented, and cooperative. Patient endorses AH last night and VH of blurry stuff. Denies SI, HI, and verbally contracts for safety. Patient reports he slept fair last night without sleeping medication. Patient reports his appetite as good, energy level as normal, and concentration as good. Patient rates his depression 7/10, hopelessness 7/10, and anxiety 7/10. Patient reports right hand pain rated 3/10.    A: Scheduled medications administered per MD order. PRN tylenol administered without effectiveness. Support provided. Patient educated on safety on the unit and medications. Routine safety checks every 15 minutes. Patient stated understanding to tell nurse about any new physical symptoms. Patient understands to tell staff of any needs.     R: No adverse drug reactions noted. Patient remains safe at this time and will continue to monitor.    01/13/24 1000  Psych Admission Type (Psych Patients Only)  Admission Status Voluntary  Psychosocial Assessment  Patient Complaints Irritability  Eye Contact Fair  Facial Expression Flat  Affect Depressed  Speech Logical/coherent  Interaction Minimal  Motor Activity Slow  Appearance/Hygiene Unremarkable  Behavior Characteristics Cooperative  Mood Depressed  Thought Process  Coherency WDL  Content WDL  Delusions None reported or observed  Perception Hallucinations  Hallucination Auditory;Visual  Judgment Impaired  Confusion None  Danger to Self  Current suicidal ideation? Denies  Danger to Others  Danger to Others None reported or observed  Danger to Others Abnormal  Harmful Behavior to others No threats or harm toward other people  Destructive Behavior No threats or harm toward property

## 2024-01-13 NOTE — Progress Notes (Signed)
   01/13/24 0600  15 Minute Checks  Location Bedroom  Visual Appearance Calm  Behavior Composed  Sleep (Behavioral Health Patients Only)  Calculate sleep? (Click Yes once per 24 hr at 0600 safety check) Yes  Documented sleep last 24 hours 7

## 2024-01-14 ENCOUNTER — Encounter (HOSPITAL_COMMUNITY): Payer: Self-pay

## 2024-01-14 DIAGNOSIS — F3163 Bipolar disorder, current episode mixed, severe, without psychotic features: Secondary | ICD-10-CM | POA: Diagnosis not present

## 2024-01-14 MED ORDER — QUETIAPINE FUMARATE 400 MG PO TABS
400.0000 mg | ORAL_TABLET | Freq: Every day | ORAL | Status: DC
  Administered 2024-01-14 – 2024-01-15 (×2): 400 mg via ORAL
  Filled 2024-01-14 (×4): qty 1
  Filled 2024-01-14: qty 2

## 2024-01-14 NOTE — Progress Notes (Signed)
   01/13/24 2200  Psych Admission Type (Psych Patients Only)  Admission Status Voluntary  Psychosocial Assessment  Patient Complaints Sleep disturbance (Reported trouble sleeping last night due to staff opening his door while doing safety checks. Informed pt that his door will be kept half-way open today to help eliminate noise/decrease his paranoia. Pt agreed to this plan.)  Eye Contact Fair  Facial Expression Flat  Affect Irritable;Anxious;Depressed  Speech Logical/coherent  Interaction Cautious;Minimal  Motor Activity Slow  Appearance/Hygiene Unremarkable  Behavior Characteristics Cooperative;Appropriate to situation;Fidgety  Mood Depressed;Pleasant  Thought Process  Coherency WDL  Content Paranoia (Sometimes he feels like people can be his "enemy," which causes him to have HI. Tonight, pt denies HI and agrees to inform staff immediately if he has any thoughts/plan/intent of harming herself or anyone else. Pt verbally contracts for safety.)  Delusions Paranoid  Perception Hallucinations  Hallucination Auditory  Judgment Impaired  Confusion None  Danger to Self  Current suicidal ideation? Denies  Danger to Others  Danger to Others None reported or observed  Danger to Others Abnormal  Harmful Behavior to others No threats or harm toward other people  Destructive Behavior No threats or harm toward property
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
## 2024-01-14 NOTE — Plan of Care (Signed)
Problem: Activity: Goal: Interest or engagement in activities will improve Outcome: Progressing   Problem: Coping: Goal: Ability to verbalize frustrations and anger appropriately will improve Outcome: Progressing   Problem: Safety: Goal: Periods of time without injury will increase Outcome: Progressing

## 2024-01-14 NOTE — Plan of Care (Signed)
  Problem: Education: Goal: Knowledge of [facility] Education information/materials will improve Outcome: Progressing Goal: Emotional status will improve Outcome: Progressing Goal: Mental status will improve Outcome: Progressing Goal: Verbalization of understanding the information provided will improve Outcome: Progressing  Patient endorses minimal AH reports improving. Denies SI/HI/VH and verbally contracts for safety. Compliant with medications. Prn Vistaril given for anxiety at HS and effective No adverse effects noted. Support ongoing.

## 2024-01-14 NOTE — Progress Notes (Signed)
   01/14/24 0800  Psych Admission Type (Psych Patients Only)  Admission Status Voluntary  Psychosocial Assessment  Patient Complaints Sleep disturbance  Eye Contact Fair  Facial Expression Flat  Affect Depressed  Speech Logical/coherent  Interaction Cautious;Minimal  Motor Activity Slow  Appearance/Hygiene Unremarkable  Behavior Characteristics Cooperative;Appropriate to situation  Mood Depressed;Pleasant  Thought Process  Coherency WDL  Content Paranoia  Delusions Paranoid  Perception Hallucinations  Hallucination Auditory  Judgment Impaired  Confusion None  Danger to Self  Current suicidal ideation? Denies  Danger to Others  Danger to Others None reported or observed  Danger to Others Abnormal  Harmful Behavior to others No threats or harm toward other people  Destructive Behavior No threats or harm toward property
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
## 2024-01-14 NOTE — Group Note (Signed)
Recreation Therapy Group Note   Group Topic:Stress Management  Group Date: 01/14/2024 Start Time: 0932 End Time: 4782 Facilitators: Skyanne Welle-McCall, LRT,CTRS Location: 300 Hall Dayroom   Group Topic: Stress Management  Goal Area(s) Addresses:  Patient will identify positive stress management techniques. Patient will identify benefits of using stress management post d/c.  Intervention: Meditation App   Activity: Meditation. LRT played a meditation that focused on over thinking situations. The meditation focused on how to control anxiety, think positive thoughts and stay in the moment as opposed to thinking ahead about situations beyond your control.     Education:  Stress Management, Discharge Planning.   Education Outcome: Acknowledges Education   Affect/Mood: Appropriate   Participation Level: Active   Participation Quality: Independent   Behavior: Attentive    Speech/Thought Process: Focused   Insight: Good   Judgement: Good   Modes of Intervention: Meditation   Patient Response to Interventions:  Engaged   Education Outcome:  In group clarification offered    Clinical Observations/Individualized Feedback: Pt attended and participated in group session.     Plan: Continue to engage patient in RT group sessions 2-3x/week.   Ahmani Daoud-McCall, LRT,CTRS 01/14/2024 12:23 PM

## 2024-01-14 NOTE — BH IP Treatment Plan (Signed)
Interdisciplinary Treatment and Diagnostic Plan Update  01/14/2024 Time of Session: 11:10AM - UPDATE Seth Mcintyre MRN: [id number]  Principal Diagnosis: Bipolar 1 disorder, mixed, severe (HCC)  Secondary Diagnoses: Principal Problem:   Bipolar 1 disorder, mixed, severe (HCC)   Current Medications:  Current Facility-Administered Medications  Medication Dose Route Frequency Provider Last Rate Last Admin   acetaminophen (TYLENOL) tablet 650 mg  650 mg Oral Q6H PRN Margaretmary Dys, MD   650 mg at 01/14/24 1033   alum & mag hydroxide-simeth (MAALOX/MYLANTA) 200-200-20 MG/5ML suspension 30 mL  30 mL Oral Q4H PRN Margaretmary Dys, MD       amLODipine (NORVASC) tablet 10 mg  10 mg Oral Daily Armandina Stammer I, NP   10 mg at 01/14/24 2952   haloperidol (HALDOL) tablet 5 mg  5 mg Oral TID PRN Margaretmary Dys, MD       And   diphenhydrAMINE (BENADRYL) capsule 50 mg  50 mg Oral TID PRN Margaretmary Dys, MD       haloperidol lactate (HALDOL) injection 5 mg  5 mg Intramuscular TID PRN Margaretmary Dys, MD       And   diphenhydrAMINE (BENADRYL) injection 50 mg  50 mg Intramuscular TID PRN Margaretmary Dys, MD       And   LORazepam (ATIVAN) injection 2 mg  2 mg Intramuscular TID PRN Margaretmary Dys, MD       haloperidol lactate (HALDOL) injection 10 mg  10 mg Intramuscular TID PRN Margaretmary Dys, MD       And   diphenhydrAMINE (BENADRYL) injection 50 mg  50 mg Intramuscular TID PRN Margaretmary Dys, MD       And   LORazepam (ATIVAN) injection 2 mg  2 mg Intramuscular TID PRN Margaretmary Dys, MD       FLUoxetine (PROZAC) capsule 30 mg  30 mg Oral Daily [name], MD   30 mg at 01/14/24 8413   hydrOXYzine (ATARAX) tablet 25 mg  25 mg Oral TID PRN Margaretmary Dys, MD   25 mg at 01/14/24 1033   magnesium hydroxide (MILK OF  MAGNESIA) suspension 30 mL  30 mL Oral Daily PRN Armandina Stammer I, NP       prazosin (MINIPRESS) capsule 2 mg  2 mg Oral QHS Margaretmary Dys, MD   2 mg at 01/13/24 2115   QUEtiapine (SEROQUEL) tablet 300 mg  300 mg Oral QHS [name], MD   300 mg at 01/13/24 2115   Vitamin D (Ergocalciferol) (DRISDOL) 1.25 MG (50000 UNIT) capsule 50,000 Units  50,000 Units Oral Q7 days Golda Acre, MD   50,000 Units at 01/09/24 1024   vitamin D3 (CHOLECALCIFEROL) tablet 2,000 Units  2,000 Units Oral Daily Golda Acre, MD   2,000 Units at 01/14/24 [phone]   PTA Medications: Medications Prior to Admission  Medication Sig Dispense Refill Last Dose/Taking   FLUoxetine (PROZAC) 20 MG capsule Take 1 capsule (20 mg total) by mouth daily.       Patient Stressors: Marital or family conflict    Patient Strengths: Printmaker for treatment/growth  Religious Affiliation   Treatment Modalities: Medication Management, Group therapy, Case management,  1 to 1 session with clinician, Psychoeducation, Recreational therapy.   Physician Treatment Plan for Primary Diagnosis: Bipolar 1 disorder, mixed, severe (HCC) Long Term Goal(s):     Short  Term Goals: Ability to identify changes in lifestyle to reduce recurrence of condition will improve Ability to verbalize feelings will improve Ability to disclose and discuss suicidal ideas Ability to demonstrate self-control will improve Ability to identify and develop effective coping behaviors will improve Ability to maintain clinical measurements within normal limits will improve Compliance with prescribed medications will improve Ability to identify triggers associated with substance abuse/mental health issues will improve  Medication Management: Evaluate patient's response, side effects, and tolerance of medication regimen.  Therapeutic Interventions: 1 to 1 sessions, Unit Group sessions and Medication  administration.  Evaluation of Outcomes: Progressing  Physician Treatment Plan for Secondary Diagnosis: Principal Problem:   Bipolar 1 disorder, mixed, severe (HCC)  Long Term Goal(s):     Short Term Goals: Ability to identify changes in lifestyle to reduce recurrence of condition will improve Ability to verbalize feelings will improve Ability to disclose and discuss suicidal ideas Ability to demonstrate self-control will improve Ability to identify and develop effective coping behaviors will improve Ability to maintain clinical measurements within normal limits will improve Compliance with prescribed medications will improve Ability to identify triggers associated with substance abuse/mental health issues will improve     Medication Management: Evaluate patient's response, side effects, and tolerance of medication regimen.  Therapeutic Interventions: 1 to 1 sessions, Unit Group sessions and Medication administration.  Evaluation of Outcomes: Progressing   RN Treatment Plan for Primary Diagnosis: Bipolar 1 disorder, mixed, severe (HCC) Long Term Goal(s): Knowledge of disease and therapeutic regimen to maintain health will improve  Short Term Goals: Ability to remain free from injury will improve, Ability to verbalize frustration and anger appropriately will improve, Ability to participate in decision making will improve, Ability to verbalize feelings will improve, and Ability to identify and develop effective coping behaviors will improve  Medication Management: RN will administer medications as ordered by provider, will assess and evaluate patient's response and provide education to patient for prescribed medication. RN will report any adverse and/or side effects to prescribing provider.  Therapeutic Interventions: 1 on 1 counseling sessions, Psychoeducation, Medication administration, Evaluate responses to treatment, Monitor vital signs and CBGs as ordered, Perform/monitor CIWA,  COWS, AIMS and Fall Risk screenings as ordered, Perform wound care treatments as ordered.  Evaluation of Outcomes: Progressing   LCSW Treatment Plan for Primary Diagnosis: Bipolar 1 disorder, mixed, severe (HCC) Long Term Goal(s): Safe transition to appropriate next level of care at discharge, Engage patient in therapeutic group addressing interpersonal concerns.  Short Term Goals: Engage patient in aftercare planning with referrals and resources, Increase social support, Increase ability to appropriately verbalize feelings, Increase emotional regulation, and Increase skills for wellness and recovery  Therapeutic Interventions: Assess for all discharge needs, 1 to 1 time with Social worker, Explore available resources and support systems, Assess for adequacy in community support network, Educate family and significant other(s) on suicide prevention, Complete Psychosocial Assessment, Interpersonal group therapy.  Evaluation of Outcomes: Progressing   Progress in Treatment: Attending groups: Yes. Participating in groups: Yes. Taking medication as prescribed: Yes. Toleration medication: Yes. Family/Significant other contact made: Yes, contacted:  Wife, Vanessa Ralphs [phone] OR Gigi Gin [phone] Patient understands diagnosis: Yes. Discussing patient identified problems/goals with staff: Yes. Medical problems stabilized or resolved: Yes. Denies suicidal/homicidal ideation: Yes. Issues/concerns per patient self-inventory: No.   New problem(s) identified: No, Describe:  none reported   New Short Term/Long Term Goal(s): medication stabilization, elimination of SI thoughts, development of comprehensive mental wellness plan.  Patient Goals:  "Get well and leave"   Discharge Plan or Barriers: Patient recently admitted. CSW will continue to follow and assess for appropriate referrals and possible discharge planning.      Reason for Continuation of Hospitalization:  Depression Homicidal ideation Medication stabilization   Estimated Length of Stay: 2-4 days  Last 3 [place] Suicide Severity Risk Score: Flowsheet Row [facility] (Current[facility] Most recent reading at 01/07/2024  5:00 PM ED from 01/07/2024 in [facility] Most recent reading at 01/07/2024  2:09 PM Counselor from 05/10/2023 in [facility] Most recent reading at 05/10/2023  8:17 AM  C-SSRS RISK CATEGORY No Risk No Risk No Risk       Last PHQ 2/9 Scores:    05/10/2023    8:15 AM 04/26/2022    1:43 PM 02/22/2022   11:46 AM  Depression screen PHQ 2/9  Decreased Interest 3 3 3   Down, Depressed, Hopeless 3 3 3   PHQ - 2 Score 6 6 6   Altered sleeping 3 3 2   Tired, decreased energy 3 3 3   Change in appetite 2 3 2   Feeling bad or failure about yourself  3 3 3   Trouble concentrating 2 3 3   Moving slowly or fidgety/restless 2 3 3   Suicidal thoughts 0 3 3  PHQ-9 Score 21 27 25   Difficult doing work/chores Very difficult Extremely dIfficult Somewhat difficult    Scribe for Treatment Team: Kathi Der, Theresia Majors 01/14/2024 1:44 PM

## 2024-01-14 NOTE — [facility] Group Notes (Signed)
The focus of this group is to help patients establish daily goals to achieve during treatment and discuss how the patient can incorporate goal setting into their daily lives to aide in recovery.   Scale 1-10 6 out of 10       Goal: to talk to Doctor

## 2024-01-14 NOTE — Progress Notes (Signed)
   01/14/24 0545  15 Minute Checks  Location Bedroom  Visual Appearance Calm  Behavior Composed  Sleep (Behavioral Health Patients Only)  Calculate sleep? (Click Yes once per 24 hr at 0600 safety check) Yes  Documented sleep last 24 hours 5.75

## 2024-01-15 DIAGNOSIS — F3163 Bipolar disorder, current episode mixed, severe, without psychotic features: Secondary | ICD-10-CM | POA: Diagnosis not present

## 2024-01-15 NOTE — Progress Notes (Addendum)
Pt denied SI/AVH this morning. Pt continues to endorse passive HI reporting "I just stay to myself". Pt observed interacting well with peers and staff and attending groups throughout the day. Pt has been calm and cooperative throughout the shift. Pt given scheduled medications as prescribed. Q15 min checks verified for safety. Patient verbally contracts for safety. Patient compliant with medications and treatment plan. Pt is safe on the unit.   01/15/24 0900  Psych Admission Type (Psych Patients Only)  Admission Status Voluntary  Psychosocial Assessment  Patient Complaints Anxiety;Depression  Eye Contact Fair  Facial Expression Flat  Affect Anxious;Depressed  Speech Logical/coherent  Interaction Cautious  Motor Activity Slow  Appearance/Hygiene Unremarkable  Behavior Characteristics Cooperative  Mood Pleasant  Thought Process  Coherency WDL  Content Paranoia  Delusions Paranoid  Perception WDL  Hallucination None reported or observed  Judgment Impaired  Confusion None  Danger to Self  Current suicidal ideation? Denies  Danger to Others  Danger to Others Reported or observed  Danger to Others Abnormal  Harmful Behavior to others Threats of violence towards other people observed or expressed   Description of Harmful Behavior "I stay away from people"  Destructive Behavior No threats or harm toward property
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
## 2024-01-15 NOTE — Plan of Care (Signed)
Problem: Education: Goal: Emotional status will improve Outcome: Progressing Goal: Mental status will improve Outcome: Progressing   Problem: Activity: Goal: Interest or engagement in activities will improve Outcome: Progressing

## 2024-01-15 NOTE — [facility] Group Notes (Signed)
[facility] Group Notes:  (Nursing/MHT/Case Management/Adjunct)  Date:  01/15/2024  Time:  2000  Type of Therapy:  wrap up group  Participation Level:  Active  Participation Quality:  Appropriate and Attentive  Affect:  Appropriate  Cognitive:  Alert and Appropriate  Insight:  Appropriate and Good  Engagement in Group:  Engaged  Modes of Intervention:  Discussion  Summary of Progress/Problems:  Fay Records 01/15/2024, 9:30 PM

## 2024-01-15 NOTE — Group Note (Signed)
Date:  01/15/2024 Time:  5:03 PM  Group Topic/Focus:  Goals Group:   The focus of this group is to help patients establish daily goals to achieve during treatment and discuss how the patient can incorporate goal setting into their daily lives to aide in recovery. Orientation:   The focus of this group is to educate the patient on the purpose and policies of crisis stabilization and provide a format to answer questions about their admission.  The group details unit policies and expectations of patients while admitted.    Participation Level:  Active  Participation Quality:  Appropriate  Affect:  Appropriate  Cognitive:  Appropriate  Insight: Appropriate  Engagement in Group:  Engaged  Modes of Intervention:  Discussion, Orientation, and Rapport Building  Additional Comments:   Pt attended and participated in the Orientation and Goals group. Pt openly disclosed his challenges with managing his anger. Pt was receptive to the support and encouragement provided by peers and MHT. Pt personal goal for today is to manage his temper so he doesn't "spazz out".  Edmund Hilda Lataria Courser 01/15/2024, 5:03 PM

## 2024-01-15 NOTE — Group Note (Signed)
Recreation Therapy Group Note   Group Topic:Animal Assisted Therapy   Group Date: 01/15/2024 Start Time: 0944 End Time: 1030 Facilitators: Armonie Mettler-McCall, LRT,CTRS Location: [address] Dayroom   Animal-Assisted Activity (AAA) Program Checklist/Progress Notes Patient Eligibility Criteria Checklist & Daily Group note for Rec Tx Intervention  AAA/T Program Assumption of Risk Form signed by Patient/ or Parent Legal Guardian Yes  Patient is free of allergies or severe asthma Yes  Patient reports no fear of animals Yes  Patient reports no history of cruelty to animals Yes  Patient understands his/her participation is voluntary Yes  Patient washes hands before animal contact Yes  Patient washes hands after animal contact Yes  Education: Hand Washing, Appropriate Animal Interaction   Education Outcome: Acknowledges education.    Affect/Mood: Appropriate   Participation Level: Engaged   Participation Quality: Independent   Behavior: Appropriate   Speech/Thought Process: Focused   Insight: Good   Judgement: Good   Modes of Intervention: Teaching laboratory technician   Patient Response to Interventions:  Engaged   Education Outcome:  In group clarification offered    Clinical Observations/Individualized Feedback: Patient attended session and interacted appropriately with therapy dog and peers. Patient asked appropriate questions about therapy dog and his training. Patient shared stories about their pets at home with group. Pt was also very humorous and a great story teller. Pt was very bright and entertaining.      Plan: Continue to engage patient in RT group sessions 2-3x/week.   Seth Mcintyre, LRT,CTRS 01/15/2024 2:27 PM

## 2024-01-15 NOTE — Group Note (Signed)
LCSW Group Therapy Note   Group Date: 01/15/2024 Start Time: 1100 End Time: 1200   Type of Therapy:  Group Therapy  Topic:  Finding Balance: Using Wise Mind for Thoughtful Decisions  Participation Level:  patient was present and actively participated in the discussion   Objective:  the objective of this class is to help participants understand the concept of Wise Mind and learn how to apply it to real-life situations to make balanced, thoughtful decisions. Participants will gain tools to manage emotions, consider logic, and find a middle ground that leads to healthier responses and outcomes.  Goals: Understand the concept of Wise Mind.  Participants will learn the difference between Emotional Mind, Reasonable Mind, and Weston Settle Mind, and how Weston Settle Mind helps in balancing emotions and logic to make thoughtful decisions. Recognize when you're in Emotional Mind or Reasonable Mind.  Participants will identify the signs of Emotional Mind and Reasonable Mind in their own reactions to situations and understand how to move into Wise Mind for more balanced responses. Practice applying Weston Settle Mind to real-life situations.  Through scenarios and group activities, participants will practice using Wise Mind in everyday situations, learning how to acknowledge their emotions, think logically, and create solutions that are thoughtful and balanced.  Summary: In this class, we explored the concept of Wise Mind--the balance between Emotional Mind and Reasonable Mind. We discussed how Emotional Mind can sometimes lead to impulsive, reactive decisions driven by intense feelings, and how Reasonable Mind might ignore feelings altogether, focusing only on facts and logic. Weston Settle Mind is the middle ground that combines both, allowing you to consider your emotions and use logic to make balanced, thoughtful decisions.  We learned how to recognize when we're in Emotional Mind or Reasonable Mind, and practiced using Wise Mind in  real-life situations. By using Alfonse Flavors, we can improve how we handle challenging situations, make better decisions, and strengthen our relationships with others.   Alla Feeling, LCSWA 01/15/2024  1:41 PM
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
## 2024-01-15 NOTE — Progress Notes (Signed)
Patient has tolerated recent adjustment well.  He seems to be responding appropriately to treatment.  He slept well last night.  He is not endorsing any rageful thoughts.  There are no psychotic symptoms.  He is future oriented and not endorsing any violent thoughts.  Hopefully we can discharge him tomorrow if he maintains stability.

## 2024-01-16 ENCOUNTER — Other Ambulatory Visit: Payer: Self-pay

## 2024-01-16 DIAGNOSIS — F3163 Bipolar disorder, current episode mixed, severe, without psychotic features: Secondary | ICD-10-CM | POA: Diagnosis not present

## 2024-01-16 MED ORDER — FLUOXETINE HCL 10 MG PO CAPS
30.0000 mg | ORAL_CAPSULE | Freq: Every day | ORAL | 3 refills | Status: AC
  Filled 2024-01-17 (×2): qty 30, 10d supply, fill #0

## 2024-01-16 MED ORDER — VITAMIN D (ERGOCALCIFEROL) 1.25 MG (50000 UNIT) PO CAPS
50000.0000 [IU] | ORAL_CAPSULE | ORAL | 0 refills | Status: AC
  Filled 2024-01-17: qty 4, 28d supply, fill #0

## 2024-01-16 MED ORDER — AMLODIPINE BESYLATE 10 MG PO TABS
10.0000 mg | ORAL_TABLET | Freq: Every day | ORAL | 0 refills | Status: AC
  Filled 2024-01-17: qty 30, 30d supply, fill #0

## 2024-01-16 MED ORDER — HYDROXYZINE HCL 25 MG PO TABS
25.0000 mg | ORAL_TABLET | Freq: Three times a day (TID) | ORAL | 0 refills | Status: AC | PRN
  Filled 2024-01-17: qty 30, 10d supply, fill #0

## 2024-01-16 MED ORDER — QUETIAPINE FUMARATE 400 MG PO TABS
400.0000 mg | ORAL_TABLET | Freq: Every day | ORAL | 0 refills | Status: AC
  Filled 2024-01-17: qty 30, 30d supply, fill #0

## 2024-01-16 MED ORDER — VITAMIN D3 25 MCG PO TABS
2000.0000 [IU] | ORAL_TABLET | Freq: Every day | ORAL | 0 refills | Status: AC
  Filled 2024-01-17 – 2024-02-04 (×2): qty 60, 30d supply, fill #0

## 2024-01-16 MED ORDER — PRAZOSIN HCL 2 MG PO CAPS
2.0000 mg | ORAL_CAPSULE | Freq: Every day | ORAL | 0 refills | Status: AC
  Filled 2024-01-17: qty 30, 30d supply, fill #0

## 2024-01-16 NOTE — [facility] Suicide Risk Assessment (Signed)
[facility] INPATIENT:  Family/Significant Other Suicide Prevention Education  Suicide Prevention Education:  Education Completed; Gigi Gin [phone]) ,  (name of family member/significant other) has been identified by the patient as the family member/significant other with whom the patient will be residing, and identified as the person(s) who will aid the patient in the event of a mental health crisis (suicidal ideations/suicide attempt).  With written consent from the patient, the family member/significant other has been provided the following suicide prevention education, prior to the and/or following the discharge of the patient.  The suicide prevention education provided includes the following: Suicide risk factors Suicide prevention and interventions National Suicide Hotline telephone number [facility] assessment telephone number [facility] Emergency Assistance 911 [facility] and/or Residential Mobile Crisis Unit telephone number  Request made of family/significant other to: Remove weapons (e.g., guns, rifles, knives), all items previously/currently identified as safety concern.   Remove drugs/medications (over-the-counter, prescriptions, illicit drugs), all items previously/currently identified as a safety concern.  The family member/significant other verbalizes understanding of the suicide prevention education information provided.  The family member/significant other agrees to remove the items of safety concern listed above.  Kathi Der 01/16/2024, 9:22 AM

## 2024-01-16 NOTE — Plan of Care (Signed)
Problem: Education: Goal: Emotional status will improve Outcome: Progressing Goal: Mental status will improve Outcome: Progressing   Problem: Activity: Goal: Interest or engagement in activities will improve Outcome: Progressing Goal: Sleeping patterns will improve Outcome: Progressing

## 2024-01-16 NOTE — Progress Notes (Signed)
     01/16/2024       8:25 AM   Seth Mcintyre L Pehrson   Type of Note: Discharge planning  Spoke with patient this morning regarding discharge plans. This Clinical research associate offered pt list of [place] [facility] again and pt declined. Pt stated "[facility] the [facility], I have the keys and am able to go there whenever I want. By the time I find a job to pay for an [employer], I'll be moved back into my own home by then." Pt reports that his wife is going downtown to revoke the 50B and pt will then transition back home.  Signed:  Loyed Wilmes, LCSW-A 01/16/2024  8:25 AM

## 2024-01-16 NOTE — Group Note (Signed)
Date:  01/16/2024 Time:  12:01 PM  Group Topic/Focus:  Goals Group:   The focus of this group is to help patients establish daily goals to achieve during treatment and discuss how the patient can incorporate goal setting into their daily lives to aide in recovery. Orientation:   The focus of this group is to educate the patient on the purpose and policies of crisis stabilization and provide a format to answer questions about their admission.  The group details unit policies and expectations of patients while admitted.    Participation Level:  Did Not Attend  Participation Quality:   n/a  Affect:   n/a  Cognitive:   n/a  Insight: None  Engagement in Group:   n/a  Modes of Intervention:   n/a  Additional Comments:   Pt did not attend.  Edmund Hilda Ryonna Cimini 01/16/2024, 12:01 PM

## 2024-01-16 NOTE — Progress Notes (Signed)
     01/16/2024       9:18 AM   Oneal Deputy Burck   Spoke with Gigi Gin [phone]) again this morning who [facility].  Signed:  Alexsander Cavins, LCSW-A 01/16/2024  9:18 AM
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
## 2024-01-16 NOTE — Transportation (Signed)
01/16/2024  Seth Mcintyre DOB: August 14, 1966 MRN: [id number]   RIDER WAIVER AND RELEASE OF LIABILITY  For the purposes of helping with transportation needs, [facility] partners with outside transportation providers (taxi companies, [place], Catering manager.) to give [employer] patients or other approved people the choice of on-demand rides [employer]") to our buildings for non-emergency visits.  By using [employer], I, the person signing this document, on behalf of myself and/or any legal minors (in my care using the [employer]), agree:  Science writer given to me are supplied by independent, outside transportation providers who do not work for, or have any affiliation with, [employer]. [facility] is not a transportation company. [facility] has no control over the quality or safety of the rides I get using [employer]. [facility] has no control over whether any outside ride will happen on time or not. [facility] [facility] gives no guarantee on the reliability, quality, safety, or availability on any rides, or that no mistakes will happen. I know and accept that traveling by vehicle (car, truck, SVU, Zenaida Niece, bus, taxi, etc.) has risks of serious injuries such as disability, being paralyzed, and death. I know and agree the risk of using [employer] is mine alone, and not [employer]. Transport Services are provided "as is" and as are available. The transportation providers are in charge for all inspections and care of the vehicles used to provide these rides. I agree not to take legal action against [place], its agents, employees, officers, directors, representatives, insurers, attorneys, assigns, successors, subsidiaries, and affiliates at any time for any reasons related directly or indirectly to using [employer]. I also agree not to take legal action against [facility] or its affiliates for any injury, death, or damage to property caused by or related to using  [employer]. I have read this Waiver and Release of Liability, and I understand the terms used in it and their legal meaning. This Waiver is freely and voluntarily given with the understanding that my right (or any legal minors) to legal action against [place] relating to [employer] is knowingly given up to use these services.   I attest that I read the Ride Waiver and Release of Liability to Seth Mcintyre, gave Seth Mcintyre the opportunity to ask questions and answered the questions asked (if any). I affirm that Seth Mcintyre then provided consent for assistance with transportation.

## 2024-01-16 NOTE — Group Note (Signed)
Recreation Therapy Group Note   Group Topic:Health and Wellness  Group Date: 01/16/2024 Start Time: 0930 End Time: 1000 Facilitators: Cleto Claggett-McCall, LRT,CTRS Location: [address] Day[address]   Group Topic: Wellness  Goal Area(s) Addresses:  Patient will define components of whole wellness. Patient will verbalize benefit of whole wellness.  Intervention: Music  Activity: Exercise. Patients took turns leading the group in the exercises of their choosing. The group completed three rounds of stretching and exercise. Patients were encouraged to get water, take breaks and not to over extend themselves during the course of the activity. Emphasis was placed on patients paying attention to their bodies and what exercises they are able to complete.   Education: Wellness, Building control surveyor.   Education Outcome: Acknowledges education/In group clarification offered/Needs additional education.   Affect/Mood: Appropriate   Participation Level: Engaged   Participation Quality: Independent   Behavior: Appropriate   Speech/Thought Process: Focused   Insight: Good   Judgement: Good   Modes of Intervention: Music   Patient Response to Interventions:  Engaged   Education Outcome:  In group clarification offered    Clinical Observations/Individualized Feedback: Pt was engaged during group session. Pt was appropriate and able to follow along with the exercises. Pt was called out of group to meet with NP and did not return.    Plan: Continue to engage patient in RT group sessions 2-3x/week.   Kilynn Fitzsimmons-McCall, LRT,CTRS  01/16/2024 1:01 PM

## 2024-01-16 NOTE — Plan of Care (Signed)
  Problem: Education: Goal: Emotional status will improve 01/16/2024 0400 by Villa Herb, RN Outcome: Progressing 01/16/2024 0400 by Villa Herb, RN Outcome: Progressing Goal: Mental status will improve Outcome: Progressing

## 2024-01-16 NOTE — Progress Notes (Signed)
   01/15/24 2130  Psych Admission Type (Psych Patients Only)  Admission Status Voluntary  Psychosocial Assessment  Patient Complaints None  Eye Contact Fair  Affect Preoccupied  Speech Logical/coherent  Interaction Assertive  Appearance/Hygiene Unremarkable  Behavior Characteristics Cooperative;Appropriate to situation  Mood Pleasant;Euthymic (Pt shared about negative interaction with peer earlier, "I'm humble right now.  The meds help me manage.")  Thought Process  Coherency WDL  Content WDL  Delusions None reported or observed  Perception WDL  Hallucination None reported or observed  Judgment WDL  Confusion None  Danger to Self  Current suicidal ideation? Denies  Agreement Not to Harm Self Yes  Description of Agreement Verbal  Danger to Others  Danger to Others None reported or observed  Danger to Others Abnormal  Harmful Behavior to others No threats or harm toward other people  Destructive Behavior No threats or harm toward property

## 2024-01-16 NOTE — Progress Notes (Signed)
  [facility] Adult Case Management Discharge Plan :  Will you be returning to the same living situation after discharge:  Yes,  [facility], do you have transportation home?: Yes,  CSW arranged [employer] taxi for 1:00PM Do you have the ability to pay for your medications: Yes,  pt has active [employer]  Release of information consent forms completed and in the chart;  Patient's signature needed at discharge.  Patient to Follow up at:  Follow-up Information     [facility] Follow up.   Why: You may also call this provider to personally schedule an appointment, if you wish to receive therapy and/or medication management in the future. Contact information: [address], [place], [place] [address]  Phone: [phone]        [name] Go to.   Specialty: Professional Counselor Why: Please go to this provider to establish therapy services on Friday, 1/31 at 9:00AM. Otherwise, you may go Monday through Friday, from 9 am to 1 pm to establish care. Contact information: [address] [place] [address] [phone]         [facility] Go on 02/04/2024.   Why: You have an appointment for medication management services on 02/04/24 at 1:10 pm. The appointment will be held in person. Contact information: [address] [place] [address] [phone]                 Next level of care provider has access to [facility] Link:no  Safety Planning and Suicide Prevention discussed: Yes,  Seth Mcintyre [phone])     Has patient been referred to the Quitline?: Patient does not use tobacco/nicotine products pt reports he does not smoke  Patient has been referred for addiction treatment: No known substance use disorder. Pt reports not using substances  Kathi Der, LCSWA 01/16/2024, 9:36 AM

## 2024-01-16 NOTE — Progress Notes (Signed)
   01/16/24 0800  Psych Admission Type (Psych Patients Only)  Admission Status Voluntary  Psychosocial Assessment  Patient Complaints None  Eye Contact Fair  Facial Expression Flat;Sad  Affect Depressed;Sad;Flat  Speech Logical/coherent  Interaction Assertive  Motor Activity Slow  Appearance/Hygiene Unremarkable  Behavior Characteristics Cooperative  Mood Depressed;Sad  Thought Process  Coherency WDL  Content WDL  Delusions None reported or observed  Perception WDL  Hallucination None reported or observed  Judgment Impaired  Confusion None  Danger to Self  Current suicidal ideation? Denies  Agreement Not to Harm Self Yes  Description of Agreement Verbal  Danger to Others  Danger to Others None reported or observed  Danger to Others Abnormal  Harmful Behavior to others No threats or harm toward other people

## 2024-01-16 NOTE — Discharge Summary (Signed)
Physician Discharge Summary Note  Patient:  Seth Mcintyre is an 58 y.o., male MRN:  [id number] DOB:  1966/03/18 Patient phone:  [phone] (home)  Patient address:   Homeless  [address] [place] [address],   Total Time spent with patient: 45 minutes  Date of Admission:  01/07/2024 Date of Discharge:   01/16/2024  Reason for Admission:  Seth Mcintyre is a 58 y.o. male  with a past psychiatric history of historical diagnosis of bipolar disorder and PTSD. Patient initially arrived to [facility] on 1/20 for intrusive homicidal thoughts, worsening depressed mood, and passive suicidal ideation in the setting of housing instability, and admitted to [facility] voluntarily on 1/20 for acute safety concerns, crisis stabalization, impaired functioning, homicidal behaviors, severe substance-induced psychosis or mood disturbances, and intensive therapeutic interventions. PMHx is non-contributory.   Principal Problem: Bipolar 1 disorder, mixed, severe (HCC) Discharge Diagnoses: Principal Problem:   Bipolar 1 disorder, mixed, severe (HCC)  Past Psychiatric History: Previous Psych Diagnoses:  Bipolar I Disorder, PTSD, polysubstance abuse (in remission except for [facility]), Alcohol Use disorder Prior inpatient treatment: first hospitalized at age 73 for suicide attempt via self-immolation. Multiple hospitalizations over life course. Current/prior outpatient treatment: no current. Denies recent. Prior rehab hx: denies Psychotherapy hx: does not remember when History of suicide: 3x attempts, one via self immolation, 2 via intentional fentanyl overdose, last in 2019.  History of homicide or aggression: Aggression yes, homicide no. Psychiatric medication history: per chart review: olanzapine, midazolam, mirtazapine, quetiapine, sertraline, trazodone, ziprasidone. On interview, patient said that he does not know the names of these medications Psychiatric medication compliance history: patient "has  never" taken his medications outpatient.  Neuromodulation history: no Current Psychiatrist: most recent saw A Pashayan in 04/2023 Current therapist: no   Past Medical History:  Past Medical History:  Diagnosis Date   Bipolar disorder (HCC)    H/O psychiatric hospitalization    Homicidal ideation    PTSD (post-traumatic stress disorder)    Suicidal ideation     Past Surgical History:  Procedure Laterality Date   APPENDECTOMY     Family History:  Family History  Family history unknown: Yes   Family Psychiatric  History: See H&P Social History:  Social History   Substance and Sexual Activity  Alcohol Use Yes   Alcohol/week: 8.0 standard drinks of alcohol   Types: 8 Cans of beer per week     Social History   Substance and Sexual Activity  Drug Use Yes   Types: Marijuana    Social History   Socioeconomic History   Marital status: Married    Spouse name: Metallurgist   Number of children: Not on file   Years of education: Not on file   Highest education level: Not on file  Occupational History   Not on file  Tobacco Use   Smoking status: Never   Smokeless tobacco: Never  Vaping Use   Vaping status: Never Used  Substance and Sexual Activity   Alcohol use: Yes    Alcohol/week: 8.0 standard drinks of alcohol    Types: 8 Cans of beer per week   Drug use: Yes    Types: Marijuana   Sexual activity: Yes  Other Topics Concern   Not on file  Social History Narrative   Not on file   Social Drivers of Health   Financial Resource Strain: Low Risk  (09/30/2019)   Overall Financial Resource Strain (CARDIA)    Difficulty of Paying Living Expenses: Not hard  at all  Food Insecurity: Food Insecurity Present (01/07/2024)   Hunger Vital Sign    Worried About Running Out of Food in the Last Year: Often true    Ran Out of Food in the Last Year: Often true  Transportation Needs: No Transportation Needs (01/07/2024)   PRAPARE - Administrator, Civil Service  (Medical): No    Lack of Transportation (Non-Medical): No  Physical Activity: Insufficiently Active (09/30/2019)   Exercise Vital Sign    Days of Exercise per Week: 2 days    Minutes of Exercise per Session: 30 min  Stress: No Stress Concern Present (09/30/2019)   [employer] of Occupational Health - Occupational Stress Questionnaire    Feeling of Stress : Not at all  Social Connections: Moderately Isolated (05/10/2023)   Social Connection and Isolation Panel [NHANES]    Frequency of Communication with Friends and Family: Once a week    Frequency of Social Gatherings with Friends and Family: Never    Attends Religious Services: More than 4 times per year    Active Member of [employer] or Organizations: No    Attends Banker Meetings: Never    Marital Status: Married    Hospital Course:  During the patient's hospitalization, patient had extensive initial psychiatric evaluation, and follow-up psychiatric evaluations every day.  Psychiatric diagnoses provided upon initial assessment:  Bipolar 1 disorder, mixed, severe (HCC)   Patient's psychiatric medications were adjusted on admission:  - Increase olanzapine 5 mg ? 10 mg nightly for major depression with psychotic features. - Continue Prozac 20 mg daily for depressed mood - Continue prazosin 2 mg nightly for nightmares.  During the hospitalization, other adjustments were made to the patient's psychiatric medication regimen:  Olanzapine was discontinued due to therapeutic noneffectiveness Seroquel 400 mg p.o. q. nightly was initiated denies Patient's care was discussed during the interdisciplinary team meeting every day during the hospitalization.  The patient denies having side effects to prescribed psychiatric medication.  Gradually, patient started adjusting to milieu. The patient was evaluated each day by a clinical provider to ascertain response to treatment. Improvement was noted by the patient's report of  decreasing symptoms, improved sleep and appetite, affect, medication tolerance, behavior, and participation in unit programming.  Patient was asked each day to complete a self inventory noting mood, mental status, pain, new symptoms, anxiety and concerns.    Symptoms were reported as significantly decreased or resolved completely by discharge.   On day of discharge, the patient reports that their mood is stable. The patient denied having suicidal thoughts for more than 48 hours prior to discharge.  Patient denies having homicidal thoughts.  Patient denies having auditory hallucinations.  Patient denies any visual hallucinations or other symptoms of psychosis. The patient was motivated to continue taking medication with a goal of continued improvement in mental health.   The patient reports their target psychiatric symptoms of psychosis responded well to the psychiatric medications, and the patient reports overall benefit other psychiatric hospitalization. Supportive psychotherapy was provided to the patient. The patient also participated in regular group therapy while hospitalized. Coping skills, problem solving as well as relaxation therapies were also part of the unit programming.  Labs were reviewed with the patient, and abnormal results were discussed with the patient.  The patient is able to verbalize their individual safety plan to this provider.  # It is recommended to the patient to continue psychiatric medications as prescribed, after discharge from the hospital.    #  It is recommended to the patient to follow up with your outpatient psychiatric provider and PCP.  # It was discussed with the patient, the impact of alcohol, drugs, tobacco have been there overall psychiatric and medical wellbeing, and total abstinence from substance use was recommended the patient.ed.  # Prescriptions provided or sent directly to preferred pharmacy at discharge. Patient agreeable to plan. Given opportunity  to ask questions. Appears to feel comfortable with discharge.    # In the event of worsening symptoms, the patient is instructed to call the crisis hotline, 911 and or go to the nearest ED for appropriate evaluation and treatment of symptoms. To follow-up with primary care provider for other medical issues, concerns and or health care needs  # Patient was discharged to home with discharge pastor with a plan to follow up as noted below.   Physical Findings: AIMS:  , ,  ,  ,    CIWA:    COWS:     Musculoskeletal: Strength & Muscle Tone: within normal limits Gait & Station: normal Patient leans: N/A  Psychiatric Specialty Exam:  Presentation  General Appearance:  Casual  Eye Contact: Good  Speech: Clear and Coherent  Speech Volume: Normal  Handedness: Right  Mood and Affect  Mood: Euthymic  Affect: Appropriate  Thought Process  Thought Processes: Coherent  Descriptions of Associations:Intact  Orientation:Full (Time, Place and Person)  Thought Content:Logical  History of Schizophrenia/Schizoaffective disorder:Yes  Duration of Psychotic Symptoms:Greater than six months  Hallucinations:Hallucinations: None Description of Auditory Hallucinations: Denies Description of Visual Hallucinations: Denies  Ideas of Reference:Paranoia  Suicidal Thoughts:Suicidal Thoughts: No (Minimized) SI Passive Intent and/or Plan: -- (Denies)  Homicidal Thoughts:Homicidal Thoughts: No HI Active Intent and/or Plan: -- (Denies) HI Passive Intent and/or Plan: -- (Denies)  Sensorium  Memory: Immediate Good; Recent Good  Judgment: Fair  Insight: Fair  Executive Functions  Concentration: Good  Attention Span: Good  Recall: Fair  Fund of Knowledge: Fair  Language: Good  Psychomotor Activity  Psychomotor Activity: Psychomotor Activity: Normal  Assets  Assets: Communication Skills; Desire for Improvement; Physical Health; Social Support  Sleep   Sleep: Sleep: Good Number of Hours of Sleep: 8  Physical Exam: Physical Exam Vitals and nursing note reviewed.  Constitutional:      Appearance: He is obese.  HENT:     Head: Normocephalic.     Nose: Nose normal.     Mouth/Throat:     Mouth: Mucous membranes are moist.     Pharynx: Oropharynx is clear.  Eyes:     Extraocular Movements: Extraocular movements intact.  Cardiovascular:     Rate and Rhythm: Normal rate.     Pulses: Normal pulses.  Pulmonary:     Effort: Pulmonary effort is normal.  Abdominal:     Comments: Deferred  Genitourinary:    Comments: Deferred Musculoskeletal:        General: Normal range of motion.     Cervical back: Normal range of motion.  Skin:    General: Skin is warm.  Neurological:     General: No focal deficit present.     Mental Status: He is alert and oriented to person, place, and time.  Psychiatric:        Mood and Affect: Mood normal.        Behavior: Behavior normal.        Thought Content: Thought content normal.    Review of Systems  Constitutional:  Negative for chills and fever.  HENT:  Negative for  sore throat.   Eyes:  Negative for blurred vision.  Respiratory:  Negative for cough, sputum production, shortness of breath and wheezing.   Cardiovascular:  Negative for chest pain and palpitations.  Gastrointestinal:  Negative for abdominal pain, constipation, diarrhea, heartburn, nausea and vomiting.  Genitourinary:  Negative for dysuria, frequency and urgency.  Musculoskeletal: Negative.   Skin:  Negative for itching and rash.  Neurological:  Negative for dizziness, tingling, tremors and headaches.  Endo/Heme/Allergies:        See allergy listing  Psychiatric/Behavioral:  Positive for depression (Stable with medication) and hallucinations (Stable with medication). The patient is nervous/anxious (Improved with medication).    Blood pressure 126/89, pulse 71, temperature 98.6 F (37 C), temperature source Oral, resp. rate  16, height 5\' 9"  (1.753 m), weight 95.7 kg, SpO2 97%. Body mass index is 31.16 kg/m.  Social History   Tobacco Use  Smoking Status Never  Smokeless Tobacco Never   Tobacco Cessation:  N/A, patient does not currently use tobacco products  Blood Alcohol level:  Lab Results  Component Value Date   ETH <10 01/07/2024   ETH 108 (H) 05/08/2023   Metabolic Disorder Labs:  Lab Results  Component Value Date   HGBA1C 5.0 01/07/2024   MPG 96.8 01/07/2024   No results found for: "PROLACTIN" Lab Results  Component Value Date   CHOL 159 01/08/2024   TRIG 115 01/08/2024   HDL 52 01/08/2024   CHOLHDL 3.1 01/08/2024   VLDL 23 01/08/2024   LDLCALC 84 01/08/2024    See Psychiatric Specialty Exam and Suicide Risk Assessment completed by Attending Physician prior to discharge.  Discharge destination:  Home  Is patient on multiple antipsychotic therapies at discharge:  No   Has Patient had three or more failed trials of antipsychotic monotherapy by history:  No  Recommended Plan for Multiple Antipsychotic Therapies: NA  Discharge Instructions     Increase activity slowly   Complete by: As directed       Allergies as of 01/16/2024   No Known Allergies      Medication List     TAKE these medications      Indication  amLODipine 10 MG tablet Commonly known as: NORVASC Take 1 tablet (10 mg total) by mouth daily. Start taking on: January 17, 2024  Indication: High Blood Pressure   FLUoxetine 10 MG capsule Commonly known as: PROZAC Take 3 capsules (30 mg total) by mouth daily. Start taking on: January 17, 2024 What changed:  medication strength how much to take  Indication: Major Depressive Disorder   hydrOXYzine 25 MG tablet Commonly known as: ATARAX Take 1 tablet (25 mg total) by mouth 3 (three) times daily as needed for anxiety.  Indication: Feeling Anxious   prazosin 2 MG capsule Commonly known as: MINIPRESS Take 1 capsule (2 mg total) by mouth at bedtime.   Indication: Frightening Dreams   QUEtiapine 400 MG tablet Commonly known as: SEROQUEL Take 1 tablet (400 mg total) by mouth at bedtime.  Indication: Manic-Depression, Mood control   Vitamin D (Ergocalciferol) 1.25 MG (50000 UNIT) Caps capsule Commonly known as: DRISDOL Take 1 capsule (50,000 Units total) by mouth every 7 (seven) days.  Indication: Vitamin D Deficiency   vitamin D3 25 MCG tablet Commonly known as: CHOLECALCIFEROL Take 2 tablets (2,000 Units total) by mouth daily. Start taking on: January 17, 2024  Indication: Vitamin D Deficiency        Follow-up Information     [facility] Follow up.  Why: You may also call this provider to personally schedule an appointment, if you wish to receive therapy and/or medication management in the future. Contact information: [address], [place], [place] [address]  Phone: [phone]        [name] Go to.   Specialty: Professional Counselor Why: Please go to this provider to establish therapy services on Friday, 1/31 at 9:00AM. Otherwise, you may go Monday through Friday, from 9 am to 1 pm to establish care. Contact information: [address] [place] [address] [phone]         [facility] Go on 02/04/2024.   Why: You have an appointment for medication management services on 02/04/24 at 1:10 pm. The appointment will be held in person. Contact information: [address] [place] [address] [phone]                Follow-up recommendations:   Discharge Recommendations:  The patient is being discharged to home. Patient is to take his discharge medications as ordered.  See follow up above. We recommend that he participates in individual therapy to target uncontrollable agitation and substance abuse.  We recommend that he participates in therapy to target personal conflict, to improve communication skills and  conflict resolution skills. Patient is to initiate/implement a contingency based behavioral model to address his behavior. We recommend that he gets AIMS scale, height, weight, blood pressure, fasting lipid panel, fasting blood sugar in three months from discharge if he's on atypical antipsychotics.  Patient will benefit from monitoring of recurrent suicidal ideation since patient is on antidepressant medication. The patient should abstain from all illicit substances and alcohol. If the patient's symptoms worsen or do not continue to improve or if the patient becomes actively suicidal or homicidal then it is recommended that the patient return to the closest hospital emergency room or call 911 for further evaluation and treatment. National Suicide Prevention Lifeline 1800-SUICIDE or 856-765-8727. Please follow up with your primary medical doctor for all other medical needs.  The patient has been educated on the possible side effects to medications and she/her guardian is to contact a medical professional and inform outpatient provider of any new side effects of medication. He is to take regular diet and activity as tolerated.  Will benefit from moderate daily exercise. Patient and Family was educated about removing/locking any firearms, medications or dangerous products from the home.  Activity:  As tolerated Diet:  Regular Diet  Signed: Cecilie Lowers, FNP 01/16/2024, 10:26 AM

## 2024-01-16 NOTE — Progress Notes (Signed)
Patient discharged from [facility] on 01/16/24. Patient denies SI, plan, and intention. Suicide safety plan completed, reviewed with this RN, given to the patient, and a copy in the chart. Patient denies HI/AVH upon discharge. Patient is alert, oriented, and cooperative. RN provided patient with discharge paperwork and reviewed information with patient. Patient expressed that he understood all of the discharge instructions. Pt was satisfied with belongings returned to him from the locker and at bedside. Discharged patient to [facility] waiting room. Pt's taxi driver awaiting patient in the [facility] waiting room.

## 2024-01-17 ENCOUNTER — Other Ambulatory Visit: Payer: Self-pay

## 2024-01-18 NOTE — [facility] Group Notes (Signed)

## 2024-02-04 ENCOUNTER — Other Ambulatory Visit: Payer: Self-pay

## 2024-02-04 ENCOUNTER — Other Ambulatory Visit (HOSPITAL_COMMUNITY): Payer: Self-pay

## 2024-02-04 MED ORDER — DIVALPROEX SODIUM ER 500 MG PO TB24
1000.0000 mg | ORAL_TABLET | Freq: Every day | ORAL | 0 refills | Status: DC
  Filled 2024-02-04 (×2): qty 60, 30d supply, fill #0

## 2024-02-04 MED ORDER — QUETIAPINE FUMARATE 400 MG PO TABS
800.0000 mg | ORAL_TABLET | Freq: Every day | ORAL | 0 refills | Status: AC
  Filled 2024-02-04: qty 60, 30d supply, fill #0
  Filled 2024-02-04: qty 42, 21d supply, fill #0
  Filled 2024-02-04: qty 18, 9d supply, fill #0

## 2024-02-04 MED ORDER — PRAZOSIN HCL 5 MG PO CAPS
5.0000 mg | ORAL_CAPSULE | Freq: Every day | ORAL | 0 refills | Status: DC
  Filled 2024-02-04 (×2): qty 30, 30d supply, fill #0

## 2024-02-05 ENCOUNTER — Other Ambulatory Visit: Payer: Self-pay

## 2024-02-05 ENCOUNTER — Other Ambulatory Visit (HOSPITAL_BASED_OUTPATIENT_CLINIC_OR_DEPARTMENT_OTHER): Payer: Self-pay

## 2024-02-18 ENCOUNTER — Other Ambulatory Visit (HOSPITAL_COMMUNITY): Payer: Self-pay

## 2024-02-18 MED ORDER — PRAZOSIN HCL 5 MG PO CAPS
5.0000 mg | ORAL_CAPSULE | Freq: Every day | ORAL | 0 refills | Status: AC
  Filled 2024-02-18: qty 30, 30d supply, fill #0

## 2024-02-18 MED ORDER — DIVALPROEX SODIUM ER 500 MG PO TB24
1000.0000 mg | ORAL_TABLET | Freq: Every day | ORAL | 0 refills | Status: AC
  Filled 2024-02-18: qty 60, 30d supply, fill #0

## 2024-02-18 MED ORDER — QUETIAPINE FUMARATE 400 MG PO TABS
800.0000 mg | ORAL_TABLET | Freq: Every day | ORAL | 0 refills | Status: AC
  Filled 2024-02-18: qty 60, 30d supply, fill #0

## 2024-03-31 ENCOUNTER — Other Ambulatory Visit: Payer: Self-pay

## 2024-03-31 ENCOUNTER — Other Ambulatory Visit (HOSPITAL_COMMUNITY): Payer: Self-pay

## 2024-03-31 MED ORDER — PRAZOSIN HCL 5 MG PO CAPS
5.0000 mg | ORAL_CAPSULE | Freq: Every day | ORAL | 2 refills | Status: AC
  Filled 2024-03-31 (×2): qty 30, 30d supply, fill #0

## 2024-03-31 MED ORDER — DIVALPROEX SODIUM ER 500 MG PO TB24
1000.0000 mg | ORAL_TABLET | Freq: Every day | ORAL | 2 refills | Status: AC
  Filled 2024-03-31 (×2): qty 60, 30d supply, fill #0

## 2024-03-31 MED ORDER — QUETIAPINE FUMARATE 400 MG PO TABS
800.0000 mg | ORAL_TABLET | Freq: Every day | ORAL | 2 refills | Status: AC
  Filled 2024-03-31 (×2): qty 60, 30d supply, fill #0

## 2024-06-23 ENCOUNTER — Other Ambulatory Visit: Payer: Self-pay

## 2024-06-23 ENCOUNTER — Other Ambulatory Visit (HOSPITAL_COMMUNITY): Payer: Self-pay

## 2024-06-23 ENCOUNTER — Encounter (HOSPITAL_COMMUNITY): Payer: Self-pay

## 2024-06-23 MED ORDER — ARIPIPRAZOLE 20 MG PO TABS
20.0000 mg | ORAL_TABLET | Freq: Every day | ORAL | 0 refills | Status: AC
  Filled 2024-06-23 (×2): qty 30, 30d supply, fill #0

## 2024-06-23 MED ORDER — ABILIFY MAINTENA 400 MG IM PRSY
400.0000 mg | PREFILLED_SYRINGE | INTRAMUSCULAR | 2 refills | Status: DC
  Filled 2024-06-23 – 2024-06-25 (×2): qty 1, 30d supply, fill #0
  Filled 2024-07-24 (×2): qty 1, 30d supply, fill #1
  Filled 2024-08-20: qty 1, 30d supply, fill #2

## 2024-06-24 ENCOUNTER — Other Ambulatory Visit (HOSPITAL_COMMUNITY): Payer: Self-pay

## 2024-06-25 ENCOUNTER — Other Ambulatory Visit (HOSPITAL_COMMUNITY): Payer: Self-pay

## 2024-06-25 ENCOUNTER — Other Ambulatory Visit: Payer: Self-pay

## 2024-06-25 NOTE — Progress Notes (Signed)
 Specialty Pharmacy Initial Fill Coordination Note  Seth Mcintyre is a 58 y.o. male contacted today regarding initial fill of specialty medication(s) ARIPiprazole  (ABILIFY , Abilify  Maintena)   Patient requested Delivery   Delivery date: 07/01/24   Verified address: [facility]   Medication will be filled on 7/14.   Patient is aware of $4 copayment.

## 2024-06-30 ENCOUNTER — Other Ambulatory Visit: Payer: Self-pay

## 2024-06-30 ENCOUNTER — Other Ambulatory Visit (HOSPITAL_COMMUNITY): Payer: Self-pay

## 2024-07-01 ENCOUNTER — Other Ambulatory Visit (HOSPITAL_COMMUNITY): Payer: Self-pay

## 2024-07-01 NOTE — Progress Notes (Signed)
 Same-Day courier (signature required) sending 07/01/24.  Control Number: [id number]

## 2024-07-24 ENCOUNTER — Other Ambulatory Visit: Payer: Self-pay

## 2024-07-28 ENCOUNTER — Other Ambulatory Visit: Payer: Self-pay

## 2024-07-28 ENCOUNTER — Other Ambulatory Visit: Payer: Self-pay | Admitting: Pharmacy Technician

## 2024-07-28 ENCOUNTER — Other Ambulatory Visit (HOSPITAL_COMMUNITY): Payer: Self-pay

## 2024-07-28 NOTE — Progress Notes (Signed)
 [facility] is not a [facility] location so medication will need to be mailed.

## 2024-07-28 NOTE — Progress Notes (Signed)
 Specialty Pharmacy Refill Coordination Note  Seth Mcintyre is a 58 y.o. male contacted today regarding refills of specialty medication(s) ARIPiprazole  (ABILIFY , Abilify  Maintena)   Patient requested Courier to Provider Office   Delivery date: 07/29/24   Verified address: [facility] [place] [address]   Medication will be filled on 07/28/24. Inj for appt on 07/30/24.

## 2024-07-29 ENCOUNTER — Other Ambulatory Visit: Payer: Self-pay

## 2024-07-29 ENCOUNTER — Other Ambulatory Visit (HOSPITAL_COMMUNITY): Payer: Self-pay

## 2024-08-20 ENCOUNTER — Other Ambulatory Visit: Payer: Self-pay

## 2024-08-20 NOTE — Progress Notes (Signed)
 Specialty Pharmacy Refill Coordination Note  KALLUM JORGENSEN is a 58 y.o. male assessed today regarding refills of clinic administered specialty medication(s) ARIPiprazole  (ABILIFY , Abilify  Maintena)   Clinic requested Delivery   Delivery date: 08/27/24   Verified address: [facility] [place] [address]   Medication will be filled on 08/26/24.    Appointment 08/27/24.

## 2024-08-25 ENCOUNTER — Other Ambulatory Visit (HOSPITAL_COMMUNITY): Payer: Self-pay

## 2024-08-25 ENCOUNTER — Other Ambulatory Visit: Payer: Self-pay

## 2024-08-25 NOTE — Progress Notes (Signed)
 Office called back. Patient appt is 9/10 at 4pm and wanted to be sure it would be delivered in time. Offered it to be delivered 9/9 instead, office staff agreed.

## 2024-08-27 ENCOUNTER — Other Ambulatory Visit (HOSPITAL_COMMUNITY): Payer: Self-pay

## 2024-08-27 MED ORDER — RISPERIDONE 1 MG PO TABS
1.0000 mg | ORAL_TABLET | Freq: Every evening | ORAL | 0 refills | Status: DC
  Filled 2024-08-27 – 2024-08-28 (×2): qty 30, 30d supply, fill #0

## 2024-08-28 ENCOUNTER — Other Ambulatory Visit (HOSPITAL_COMMUNITY): Payer: Self-pay

## 2024-09-17 ENCOUNTER — Other Ambulatory Visit: Payer: Self-pay

## 2024-09-18 ENCOUNTER — Other Ambulatory Visit: Payer: Self-pay

## 2024-09-18 ENCOUNTER — Other Ambulatory Visit (HOSPITAL_COMMUNITY): Payer: Self-pay

## 2024-09-18 MED ORDER — ABILIFY MAINTENA 400 MG IM PRSY
PREFILLED_SYRINGE | INTRAMUSCULAR | 2 refills | Status: AC
  Filled 2024-09-18: qty 1, 28d supply, fill #0
  Filled 2024-10-13 – 2024-10-22 (×3): qty 1, 28d supply, fill #1
  Filled 2024-11-14 – 2024-11-21 (×2): qty 1, 28d supply, fill #2

## 2024-09-18 NOTE — Progress Notes (Signed)
 Specialty Pharmacy Refill Coordination Note  Seth Mcintyre is a 58 y.o. male contacted today regarding refills of specialty medication(s) ARIPiprazole  (Abilify  Jayme, ABILIFY )   Patient requested Delivery   Delivery date: 09/23/24   Verified address: [facility] [place] [address]   Medication will be filled on 09/22/24.

## 2024-09-19 ENCOUNTER — Other Ambulatory Visit: Payer: Self-pay

## 2024-09-22 ENCOUNTER — Other Ambulatory Visit: Payer: Self-pay

## 2024-09-24 ENCOUNTER — Other Ambulatory Visit (HOSPITAL_COMMUNITY): Payer: Self-pay

## 2024-09-24 MED ORDER — RISPERIDONE 1 MG PO TABS
1.0000 mg | ORAL_TABLET | Freq: Every day | ORAL | 0 refills | Status: DC
  Filled 2024-09-24 – 2024-10-09 (×3): qty 30, 30d supply, fill #0

## 2024-09-24 MED ORDER — PROPRANOLOL HCL 20 MG PO TABS
20.0000 mg | ORAL_TABLET | Freq: Three times a day (TID) | ORAL | 0 refills | Status: DC | PRN
  Filled 2024-09-24 – 2024-10-09 (×3): qty 90, 30d supply, fill #0

## 2024-10-06 ENCOUNTER — Other Ambulatory Visit (HOSPITAL_COMMUNITY): Payer: Self-pay

## 2024-10-09 ENCOUNTER — Other Ambulatory Visit (HOSPITAL_COMMUNITY): Payer: Self-pay

## 2024-10-13 ENCOUNTER — Other Ambulatory Visit (HOSPITAL_COMMUNITY): Payer: Self-pay

## 2024-10-15 ENCOUNTER — Other Ambulatory Visit: Payer: Self-pay

## 2024-10-17 ENCOUNTER — Other Ambulatory Visit: Payer: Self-pay

## 2024-10-22 ENCOUNTER — Other Ambulatory Visit: Payer: Self-pay

## 2024-10-22 ENCOUNTER — Other Ambulatory Visit (HOSPITAL_COMMUNITY): Payer: Self-pay

## 2024-10-22 NOTE — Progress Notes (Signed)
 Specialty Pharmacy Refill Coordination Note  Seth Mcintyre is a 58 y.o. male assessed today regarding refills of clinic administered specialty medication(s) ARIPiprazole  (Abilify  Jayme, ABILIFY )   Clinic requested Delivery   Delivery date: 10/24/24   Verified address: [facility] [address] [place] [address]   Medication will be filled on: 10/23/24  Spoke with [employer]

## 2024-10-23 ENCOUNTER — Other Ambulatory Visit: Payer: Self-pay

## 2024-10-25 ENCOUNTER — Other Ambulatory Visit (HOSPITAL_COMMUNITY): Payer: Self-pay

## 2024-10-25 MED ORDER — PROPRANOLOL HCL 20 MG PO TABS
20.0000 mg | ORAL_TABLET | Freq: Three times a day (TID) | ORAL | 2 refills | Status: AC | PRN
  Filled 2024-10-25: qty 90, 30d supply, fill #0

## 2024-10-25 MED ORDER — RISPERIDONE 1 MG PO TABS: 1.0000 mg | ORAL_TABLET | Freq: Every day | ORAL | 2 refills | Status: AC

## 2024-11-14 ENCOUNTER — Other Ambulatory Visit: Payer: Self-pay

## 2024-11-18 ENCOUNTER — Other Ambulatory Visit: Payer: Self-pay

## 2024-11-20 ENCOUNTER — Other Ambulatory Visit (HOSPITAL_COMMUNITY): Payer: Self-pay

## 2024-11-21 ENCOUNTER — Other Ambulatory Visit (HOSPITAL_COMMUNITY): Payer: Self-pay

## 2024-11-21 ENCOUNTER — Other Ambulatory Visit: Payer: Self-pay

## 2024-11-21 NOTE — Progress Notes (Signed)
 Specialty Pharmacy Refill Coordination Note  Seth Mcintyre is a 58 y.o. male assessed today regarding refills of clinic administered specialty medication(s) ARIPiprazole  (Abilify  Jayme, ABILIFY )   Clinic requested Delivery   Delivery date: 11/25/24   Verified address: [name] [address]   Medication will be filled on: 11/24/24

## 2024-11-24 ENCOUNTER — Other Ambulatory Visit: Payer: Self-pay

## 2024-11-25 ENCOUNTER — Other Ambulatory Visit (HOSPITAL_COMMUNITY): Payer: Self-pay

## 2024-11-25 ENCOUNTER — Other Ambulatory Visit: Payer: Self-pay

## 2024-12-15 ENCOUNTER — Other Ambulatory Visit (HOSPITAL_COMMUNITY): Payer: Self-pay

## 2024-12-15 ENCOUNTER — Emergency Department (HOSPITAL_COMMUNITY)
Admission: EM | Admit: 2024-12-15 | Discharge: 2024-12-15 | Disposition: A | Discharge status: Home / Self Care | Payer: MEDICAID | Attending: Emergency Medicine | Admitting: Emergency Medicine

## 2024-12-15 ENCOUNTER — Other Ambulatory Visit: Payer: Self-pay

## 2024-12-15 DIAGNOSIS — R519 Headache, unspecified: Secondary | ICD-10-CM | POA: Diagnosis not present

## 2024-12-15 DIAGNOSIS — J029 Acute pharyngitis, unspecified: Secondary | ICD-10-CM | POA: Diagnosis not present

## 2024-12-15 DIAGNOSIS — R112 Nausea with vomiting, unspecified: Secondary | ICD-10-CM | POA: Diagnosis not present

## 2024-12-15 DIAGNOSIS — M791 Myalgia, unspecified site: Secondary | ICD-10-CM | POA: Insufficient documentation

## 2024-12-15 DIAGNOSIS — J111 Influenza due to unidentified influenza virus with other respiratory manifestations: Secondary | ICD-10-CM

## 2024-12-15 DIAGNOSIS — R0981 Nasal congestion: Secondary | ICD-10-CM | POA: Insufficient documentation

## 2024-12-15 MED ORDER — ONDANSETRON 4 MG PO TBDP
4.0000 mg | ORAL_TABLET | Freq: Three times a day (TID) | ORAL | 0 refills | Status: AC | PRN
  Filled 2024-12-15: qty 20, 7d supply, fill #0

## 2024-12-15 NOTE — ED Provider Notes (Signed)
 " [place] EMERGENCY DEPARTMENT AT [facility] Provider Note   CSN: [id number] Arrival date & time: 12/15/24  9141     Patient presents with: Generalized Body Aches   Seth Mcintyre is a 58 y.o. male.   Patient to ED for evaluation of symptoms that started this morning of generalized body aches, nasal congestion, sore throat and nausea. Vomiting x 1. He works at [employer] and reports influenza outbreak there.   The history is provided by the patient. No language interpreter was used.       Prior to Admission medications  Medication Sig Start Date End Date Taking? Authorizing Provider  ondansetron  (ZOFRAN -ODT) 4 MG disintegrating tablet Take 1 tablet (4 mg total) by mouth every 8 (eight) hours as needed for nausea or vomiting. 12/15/24  Yes Tayli Buch, PA-C  amLODipine  (NORVASC ) 10 MG tablet Take 1 tablet (10 mg total) by mouth daily. 01/17/24   Ntuen, Tina C, FNP  ARIPiprazole  (ABILIFY ) 20 MG tablet Take 1 tablet (20 mg total) by mouth daily. 06/23/24     ARIPiprazole  ER (ABILIFY  MAINTENA) 400 MG PRSY prefilled syringe Inject 1 vial intramuscularly once a month 09/18/24     divalproex  (DEPAKOTE  ER) 500 MG 24 hr tablet Take 2 tablets (1,000 mg total) by mouth at bedtime. 02/18/24     divalproex  (DEPAKOTE  ER) 500 MG 24 hr tablet Take 2 tablets (1,000 mg total) by mouth at bedtime. 03/31/24     FLUoxetine  (PROZAC ) 10 MG capsule Take 3 capsules (30 mg total) by mouth daily. 01/17/24   Ntuen, Tina C, FNP  hydrOXYzine  (ATARAX ) 25 MG tablet Take 1 tablet (25 mg total) by mouth 3 (three) times daily as needed for anxiety. 01/16/24   Ntuen, Tina C, FNP  prazosin  (MINIPRESS ) 2 MG capsule Take 1 capsule (2 mg total) by mouth at bedtime. 01/16/24   Ntuen, Tina C, FNP  prazosin  (MINIPRESS ) 5 MG capsule Take 1 capsule (5 mg total) by mouth at bedtime. 02/18/24     prazosin  (MINIPRESS ) 5 MG capsule Take 1 capsule (5 mg total) by mouth at bedtime. 03/31/24     propranolol   (INDERAL ) 20 MG tablet Take 1 tablet (20 mg total) by mouth 3 (three) times daily as needed. 10/24/24     QUEtiapine  (SEROQUEL ) 400 MG tablet Take 1 tablet (400 mg total) by mouth at bedtime. 01/16/24   Ntuen, Tina C, FNP  QUEtiapine  (SEROQUEL ) 400 MG tablet Take 2 tablets (800 mg total) by mouth at bedtime. 02/04/24     QUEtiapine  (SEROQUEL ) 400 MG tablet Take 2 tablets (800 mg total) by mouth at bedtime. 02/18/24     QUEtiapine  (SEROQUEL ) 400 MG tablet Take 2 tablets (800 mg total) by mouth at bedtime. 03/31/24     risperiDONE  (RISPERDAL ) 1 MG tablet Take 1 tablet (1 mg total) by mouth at bedtime. 10/24/24     Vitamin D , Ergocalciferol , (DRISDOL ) 1.25 MG (50000 UNIT) CAPS capsule Take 1 capsule (50,000 Units total) by mouth every 7 (seven) days. 01/16/24   Ntuen, Tina C, FNP  vitamin D3 (CHOLECALCIFEROL ) 25 MCG tablet Take 2 tablets (2,000 Units total) by mouth daily. 01/17/24   Ntuen, Tina C, FNP    Allergies: Patient has no known allergies.    Review of Systems  Updated Vital Signs BP (!) 148/97 (BP Location: Right Arm)   Pulse 72   Temp 98.2 F (36.8 C) (Oral)   Resp 16   SpO2 98%   Physical Exam Vitals and  nursing note reviewed.  Constitutional:      Appearance: He is well-developed.  HENT:     Head: Normocephalic.  Cardiovascular:     Rate and Rhythm: Normal rate and regular rhythm.     Heart sounds: No murmur heard. Pulmonary:     Effort: Pulmonary effort is normal.     Breath sounds: Normal breath sounds. No wheezing, rhonchi or rales.  Abdominal:     Palpations: Abdomen is soft.     Tenderness: There is no abdominal tenderness. There is no guarding or rebound.  Musculoskeletal:        General: Normal range of motion.     Cervical back: Normal range of motion and neck supple.  Skin:    General: Skin is warm and dry.  Neurological:     General: No focal deficit present.     Mental Status: He is alert and oriented to person, place, and time.     (all labs ordered are  listed, but only abnormal results are displayed) Labs Reviewed - No data to display  EKG: None  Radiology: No results found.   Procedures   Medications Ordered in the ED - No data to display  Clinical Course as of 12/15/24 0918  [facility] Dec 15, 2024  0915 Patient with flu-like ss/sxs with known exposure to flu. Will treat supportively for viral illness. Discussed return precautions.  [SU]    Clinical Course User Index [SU] Odell Balls, PA-C                                 Medical Decision Making       Final diagnoses:  Influenza-like illness    ED Discharge Orders          Ordered    ondansetron  (ZOFRAN -ODT) 4 MG disintegrating tablet  Every 8 hours PRN        12/15/24 0916               Odell Balls, PA-C 12/15/24 9081    Doretha Folks, MD 12/15/24 [phone]  "

## 2024-12-15 NOTE — ED Triage Notes (Signed)
 Pt reports flulike sx starting last night. Today having body aches and a headache. Cases of flu where he works.

## 2024-12-15 NOTE — Progress Notes (Signed)
 Specialty Pharmacy Refill Coordination Note  Seth Mcintyre is a 58 y.o. male assessed today regarding refills of clinic administered specialty medication(s) ARIPiprazole  (Abilify  Jayme, ABILIFY )   Clinic requested Delivery   Delivery date: 12/17/24   Verified address: [address] [place] [address]   Medication will be filled on: 12/16/24  Appointment 12/23/24

## 2024-12-15 NOTE — Discharge Instructions (Signed)
 As we discussed, it is important to stay hydrated so drink lots of fluids. Use Zofran  for nausea as directed.   If your symptoms become significantly worse or you have any concerns about new symptoms, return to the ED or go to be seen by your doctor for recheck.

## 2024-12-16 ENCOUNTER — Other Ambulatory Visit: Payer: Self-pay | Admitting: Psychiatry

## 2024-12-16 ENCOUNTER — Other Ambulatory Visit: Payer: Self-pay

## 2024-12-22 ENCOUNTER — Other Ambulatory Visit (HOSPITAL_COMMUNITY): Payer: Self-pay

## 2024-12-23 ENCOUNTER — Other Ambulatory Visit: Payer: Self-pay

## 2024-12-23 MED ORDER — ABILIFY MAINTENA 400 MG IM PRSY
400.0000 mg | PREFILLED_SYRINGE | INTRAMUSCULAR | 2 refills | Status: AC
  Filled 2024-12-23: qty 1, 30d supply, fill #0
  Filled 2025-01-16: qty 1, 30d supply, fill #1

## 2024-12-24 ENCOUNTER — Other Ambulatory Visit: Payer: Self-pay

## 2024-12-25 ENCOUNTER — Other Ambulatory Visit: Payer: Self-pay

## 2024-12-25 ENCOUNTER — Other Ambulatory Visit (HOSPITAL_COMMUNITY): Payer: Self-pay

## 2024-12-25 MED ORDER — ABILIFY MAINTENA 300 MG IM PRSY: PREFILLED_SYRINGE | INTRAMUSCULAR | 2 refills | Status: AC

## 2024-12-25 MED ORDER — PROPRANOLOL HCL 40 MG PO TABS
40.0000 mg | ORAL_TABLET | Freq: Three times a day (TID) | ORAL | 0 refills | Status: AC
  Filled 2024-12-25: qty 90, 30d supply, fill #0

## 2025-01-04 ENCOUNTER — Other Ambulatory Visit (HOSPITAL_COMMUNITY): Payer: Self-pay

## 2025-01-16 ENCOUNTER — Other Ambulatory Visit: Payer: Self-pay

## 2025-01-16 NOTE — Progress Notes (Signed)
 Specialty Pharmacy Refill Coordination Note  Seth Mcintyre is a 59 y.o. male assessed today regarding refills of clinic administered specialty medication(s) ARIPiprazole  (Abilify  Jayme, ABILIFY )   Clinic requested Delivery   Delivery date: 01/21/25   Verified address: [name] [address]   Medication will be filled on: 01/20/25

## 2025-01-20 ENCOUNTER — Other Ambulatory Visit: Payer: Self-pay

## 2025-01-23 ENCOUNTER — Other Ambulatory Visit (HOSPITAL_COMMUNITY): Payer: Self-pay

## 2025-01-23 ENCOUNTER — Other Ambulatory Visit: Payer: Self-pay

## 2025-01-23 MED ORDER — PROPRANOLOL HCL 40 MG PO TABS
40.0000 mg | ORAL_TABLET | Freq: Three times a day (TID) | ORAL | 1 refills | Status: AC
  Filled 2025-01-23: qty 90, 30d supply, fill #0

## 2025-01-23 MED ORDER — RISPERIDONE 1 MG PO TABS
1.0000 mg | ORAL_TABLET | Freq: Every day | ORAL | 1 refills | Status: AC
  Filled 2025-01-23: qty 30, 30d supply, fill #0
